# Patient Record
Sex: Female | Born: 1970 | Race: White | Hispanic: No | Marital: Married | Smoking: Current every day smoker
Health system: Southern US, Community
[De-identification: ages and names within clinical notes are randomized; demographics above are authoritative.]

## PROBLEM LIST (undated history)

## (undated) DIAGNOSIS — J45909 Unspecified asthma, uncomplicated: Secondary | ICD-10-CM

## (undated) DIAGNOSIS — K589 Irritable bowel syndrome without diarrhea: Secondary | ICD-10-CM

## (undated) DIAGNOSIS — N809 Endometriosis, unspecified: Secondary | ICD-10-CM

## (undated) DIAGNOSIS — K6289 Other specified diseases of anus and rectum: Secondary | ICD-10-CM

## (undated) DIAGNOSIS — J939 Pneumothorax, unspecified: Secondary | ICD-10-CM

## (undated) DIAGNOSIS — J449 Chronic obstructive pulmonary disease, unspecified: Secondary | ICD-10-CM

## (undated) HISTORY — PX: ENDOMETRIAL ABLATION: SHX621

## (undated) HISTORY — PX: LAPAROSCOPIC ENDOMETRIOSIS FULGURATION: SUR769

## (undated) HISTORY — PX: ENDOMETRIAL BIOPSY: SHX622

---

## 2005-01-09 ENCOUNTER — Encounter: Admission: RE | Admit: 2005-01-09 | Discharge: 2005-01-09 | Payer: Self-pay | Admitting: Family Medicine

## 2009-02-05 ENCOUNTER — Ambulatory Visit: Payer: Self-pay | Admitting: Radiology

## 2009-02-05 ENCOUNTER — Emergency Department (HOSPITAL_BASED_OUTPATIENT_CLINIC_OR_DEPARTMENT_OTHER): Admission: EM | Admit: 2009-02-05 | Discharge: 2009-02-05 | Payer: Self-pay | Admitting: Emergency Medicine

## 2009-02-16 ENCOUNTER — Emergency Department (HOSPITAL_BASED_OUTPATIENT_CLINIC_OR_DEPARTMENT_OTHER): Admission: EM | Admit: 2009-02-16 | Discharge: 2009-02-16 | Payer: Self-pay | Admitting: Emergency Medicine

## 2009-02-26 ENCOUNTER — Emergency Department (HOSPITAL_BASED_OUTPATIENT_CLINIC_OR_DEPARTMENT_OTHER): Admission: EM | Admit: 2009-02-26 | Discharge: 2009-02-26 | Payer: Self-pay | Admitting: Emergency Medicine

## 2009-02-26 ENCOUNTER — Ambulatory Visit: Payer: Self-pay | Admitting: Diagnostic Radiology

## 2009-03-11 ENCOUNTER — Emergency Department (HOSPITAL_BASED_OUTPATIENT_CLINIC_OR_DEPARTMENT_OTHER): Admission: EM | Admit: 2009-03-11 | Discharge: 2009-03-11 | Payer: Self-pay | Admitting: Emergency Medicine

## 2009-04-13 ENCOUNTER — Emergency Department (HOSPITAL_BASED_OUTPATIENT_CLINIC_OR_DEPARTMENT_OTHER): Admission: EM | Admit: 2009-04-13 | Discharge: 2009-04-13 | Payer: Self-pay | Admitting: Emergency Medicine

## 2009-04-13 ENCOUNTER — Ambulatory Visit: Payer: Self-pay | Admitting: Diagnostic Radiology

## 2009-04-15 HISTORY — PX: CHEST TUBE INSERTION: SHX231

## 2009-04-16 ENCOUNTER — Emergency Department (HOSPITAL_BASED_OUTPATIENT_CLINIC_OR_DEPARTMENT_OTHER): Admission: EM | Admit: 2009-04-16 | Discharge: 2009-04-16 | Payer: Self-pay | Admitting: Emergency Medicine

## 2009-04-17 ENCOUNTER — Ambulatory Visit: Payer: Self-pay | Admitting: Diagnostic Radiology

## 2009-04-17 ENCOUNTER — Emergency Department (HOSPITAL_BASED_OUTPATIENT_CLINIC_OR_DEPARTMENT_OTHER): Admission: EM | Admit: 2009-04-17 | Discharge: 2009-04-17 | Payer: Self-pay | Admitting: Emergency Medicine

## 2009-05-28 ENCOUNTER — Emergency Department (HOSPITAL_BASED_OUTPATIENT_CLINIC_OR_DEPARTMENT_OTHER): Admission: EM | Admit: 2009-05-28 | Discharge: 2009-05-29 | Payer: Self-pay | Admitting: Emergency Medicine

## 2009-06-01 ENCOUNTER — Emergency Department (HOSPITAL_BASED_OUTPATIENT_CLINIC_OR_DEPARTMENT_OTHER): Admission: EM | Admit: 2009-06-01 | Discharge: 2009-06-01 | Payer: Self-pay | Admitting: Emergency Medicine

## 2009-06-01 ENCOUNTER — Ambulatory Visit: Payer: Self-pay | Admitting: Diagnostic Radiology

## 2009-06-15 ENCOUNTER — Ambulatory Visit: Payer: Self-pay | Admitting: Interventional Radiology

## 2009-06-15 ENCOUNTER — Emergency Department (HOSPITAL_BASED_OUTPATIENT_CLINIC_OR_DEPARTMENT_OTHER): Admission: EM | Admit: 2009-06-15 | Discharge: 2009-06-15 | Payer: Self-pay | Admitting: Emergency Medicine

## 2009-06-28 ENCOUNTER — Emergency Department (HOSPITAL_BASED_OUTPATIENT_CLINIC_OR_DEPARTMENT_OTHER): Admission: EM | Admit: 2009-06-28 | Discharge: 2009-06-28 | Payer: Self-pay | Admitting: Emergency Medicine

## 2009-08-03 ENCOUNTER — Ambulatory Visit: Payer: Self-pay | Admitting: Diagnostic Radiology

## 2009-08-03 ENCOUNTER — Emergency Department (HOSPITAL_BASED_OUTPATIENT_CLINIC_OR_DEPARTMENT_OTHER): Admission: EM | Admit: 2009-08-03 | Discharge: 2009-08-03 | Payer: Self-pay | Admitting: Emergency Medicine

## 2009-08-26 ENCOUNTER — Emergency Department (HOSPITAL_BASED_OUTPATIENT_CLINIC_OR_DEPARTMENT_OTHER): Admission: EM | Admit: 2009-08-26 | Discharge: 2009-08-26 | Payer: Self-pay | Admitting: Emergency Medicine

## 2010-07-03 LAB — URINALYSIS, ROUTINE W REFLEX MICROSCOPIC
Bilirubin Urine: NEGATIVE
Ketones, ur: NEGATIVE mg/dL
Leukocytes, UA: NEGATIVE
Protein, ur: NEGATIVE mg/dL
Protein, ur: NEGATIVE mg/dL
Specific Gravity, Urine: 1.004 — ABNORMAL LOW (ref 1.005–1.030)
Urobilinogen, UA: 0.2 mg/dL (ref 0.0–1.0)
pH: 7 (ref 5.0–8.0)

## 2010-07-03 LAB — CBC
HCT: 41.9 % (ref 36.0–46.0)
MCV: 95.8 fL (ref 78.0–100.0)
RDW: 14.3 % (ref 11.5–15.5)
WBC: 8.3 10*3/uL (ref 4.0–10.5)

## 2010-07-03 LAB — COMPREHENSIVE METABOLIC PANEL
Alkaline Phosphatase: 73 U/L (ref 39–117)
BUN: 4 mg/dL — ABNORMAL LOW (ref 6–23)
GFR calc Af Amer: >60 mL/min (ref >60)
GFR calc non Af Amer: >60 mL/min (ref >60)
Potassium: 3.4 mEq/L — ABNORMAL LOW (ref 3.5–5.1)
Sodium: 141 mEq/L (ref 135–145)
Total Bilirubin: 0.3 mg/dL (ref 0.3–1.2)

## 2010-07-03 LAB — URINE MICROSCOPIC-ADD ON

## 2010-07-03 LAB — DIFFERENTIAL
Basophils Absolute: 0 10*3/uL (ref 0.0–0.1)
Basophils Relative: 1 % (ref 0–1)
Eosinophils Absolute: 0.2 10*3/uL (ref 0.0–0.7)
Eosinophils Relative: 2 % (ref 0–5)
Lymphocytes Relative: 27 % (ref 12–46)
Lymphs Abs: 2.3 10*3/uL (ref 0.7–4.0)

## 2010-07-03 LAB — WET PREP, GENITAL

## 2010-07-03 LAB — GC/CHLAMYDIA PROBE AMP, GENITAL
Chlamydia, DNA Probe: NEGATIVE
GC Probe Amp, Genital: NEGATIVE

## 2010-07-03 LAB — PREGNANCY, URINE: Preg Test, Ur: NEGATIVE

## 2010-07-05 LAB — URINALYSIS, ROUTINE W REFLEX MICROSCOPIC
Glucose, UA: NEGATIVE mg/dL
Ketones, ur: NEGATIVE mg/dL
Leukocytes, UA: NEGATIVE
Nitrite: NEGATIVE
Nitrite: NEGATIVE
Specific Gravity, Urine: 1.002 — ABNORMAL LOW (ref 1.005–1.030)
Specific Gravity, Urine: 1.008 (ref 1.005–1.030)
pH: 6.5 (ref 5.0–8.0)
pH: 6.5 (ref 5.0–8.0)

## 2010-07-05 LAB — URINE MICROSCOPIC-ADD ON

## 2010-07-05 LAB — DIFFERENTIAL
Basophils Absolute: 0.1 10*3/uL (ref 0.0–0.1)
Eosinophils Absolute: 0.1 10*3/uL (ref 0.0–0.7)
Lymphocytes Relative: 20 % (ref 12–46)
Lymphocytes Relative: 28 % (ref 12–46)
Lymphs Abs: 2.1 10*3/uL (ref 0.7–4.0)
Lymphs Abs: 2.8 10*3/uL (ref 0.7–4.0)
Monocytes Relative: 4 % (ref 3–12)
Neutro Abs: 7.3 10*3/uL (ref 1.7–7.7)
Neutrophils Relative %: 65 % (ref 43–77)
Neutrophils Relative %: 74 % (ref 43–77)

## 2010-07-05 LAB — CBC
Hemoglobin: 14.9 g/dL (ref 12.0–15.0)
MCHC: 34.1 g/dL (ref 30.0–36.0)
MCV: 93.8 fL (ref 78.0–100.0)
Platelets: 256 10*3/uL (ref 150–400)
RDW: 12.4 % (ref 11.5–15.5)
RDW: 12.6 % (ref 11.5–15.5)
WBC: 10 10*3/uL (ref 4.0–10.5)

## 2010-07-05 LAB — WET PREP, GENITAL
Trich, Wet Prep: NONE SEEN
Yeast Wet Prep HPF POC: NONE SEEN

## 2010-07-05 LAB — BASIC METABOLIC PANEL
BUN: 6 mg/dL (ref 6–23)
Creatinine, Ser: 0.8 mg/dL (ref 0.4–1.2)
GFR calc non Af Amer: >60 mL/min (ref >60)

## 2010-07-05 LAB — PREGNANCY, URINE
Preg Test, Ur: NEGATIVE
Preg Test, Ur: NEGATIVE

## 2010-07-05 LAB — COMPREHENSIVE METABOLIC PANEL
ALT: 9 U/L (ref 0–35)
CO2: 24 mEq/L (ref 19–32)
Calcium: 8.7 mg/dL (ref 8.4–10.5)
Creatinine, Ser: 0.6 mg/dL (ref 0.4–1.2)
GFR calc non Af Amer: >60 mL/min (ref >60)
Glucose, Bld: 87 mg/dL (ref 70–99)
Sodium: 143 mEq/L (ref 135–145)
Total Protein: 7.3 g/dL (ref 6.0–8.3)

## 2010-07-05 LAB — GC/CHLAMYDIA PROBE AMP, GENITAL: GC Probe Amp, Genital: NEGATIVE

## 2010-07-18 LAB — URINALYSIS, ROUTINE W REFLEX MICROSCOPIC
Glucose, UA: NEGATIVE mg/dL
Ketones, ur: NEGATIVE mg/dL
Protein, ur: NEGATIVE mg/dL
pH: 7 (ref 5.0–8.0)

## 2010-07-18 LAB — URINE MICROSCOPIC-ADD ON

## 2010-07-18 LAB — POCT CARDIAC MARKERS: CKMB, poc: <1 ng/mL — ABNORMAL LOW (ref 1.0–8.0)

## 2010-07-19 LAB — COMPREHENSIVE METABOLIC PANEL
ALT: 4 U/L (ref 0–35)
AST: 11 U/L (ref 0–37)
Albumin: 3.9 g/dL (ref 3.5–5.2)
Alkaline Phosphatase: 68 U/L (ref 39–117)
Chloride: 105 mEq/L (ref 96–112)
GFR calc Af Amer: >60 mL/min (ref >60)
Potassium: 3.5 mEq/L (ref 3.5–5.1)
Sodium: 142 mEq/L (ref 135–145)
Total Bilirubin: 0.2 mg/dL — ABNORMAL LOW (ref 0.3–1.2)

## 2010-07-19 LAB — DIFFERENTIAL
Basophils Absolute: 0.1 10*3/uL (ref 0.0–0.1)
Eosinophils Absolute: 0.3 10*3/uL (ref 0.0–0.7)
Eosinophils Relative: 4 % (ref 0–5)
Lymphocytes Relative: 38 % (ref 12–46)
Monocytes Absolute: 0.6 10*3/uL (ref 0.1–1.0)

## 2010-07-19 LAB — URINE MICROSCOPIC-ADD ON

## 2010-07-19 LAB — CBC
Platelets: 207 10*3/uL (ref 150–400)
RBC: 3.97 MIL/uL (ref 3.87–5.11)
WBC: 8.4 10*3/uL (ref 4.0–10.5)

## 2010-07-19 LAB — URINALYSIS, ROUTINE W REFLEX MICROSCOPIC
Bilirubin Urine: NEGATIVE
Glucose, UA: NEGATIVE mg/dL
Specific Gravity, Urine: 1.017 (ref 1.005–1.030)

## 2010-07-19 LAB — WET PREP, GENITAL
Trich, Wet Prep: NONE SEEN
Yeast Wet Prep HPF POC: NONE SEEN

## 2010-07-19 LAB — GC/CHLAMYDIA PROBE AMP, GENITAL: Chlamydia, DNA Probe: NEGATIVE

## 2010-07-19 LAB — RPR: RPR Ser Ql: NONREACTIVE

## 2014-01-19 ENCOUNTER — Emergency Department (HOSPITAL_BASED_OUTPATIENT_CLINIC_OR_DEPARTMENT_OTHER)
Admission: EM | Admit: 2014-01-19 | Discharge: 2014-01-19 | Disposition: A | Discharge status: Home / Self Care | Payer: Self-pay | Attending: Emergency Medicine | Admitting: Emergency Medicine

## 2014-01-19 ENCOUNTER — Encounter (HOSPITAL_BASED_OUTPATIENT_CLINIC_OR_DEPARTMENT_OTHER): Payer: Self-pay | Admitting: Emergency Medicine

## 2014-01-19 DIAGNOSIS — Z79899 Other long term (current) drug therapy: Secondary | ICD-10-CM | POA: Insufficient documentation

## 2014-01-19 DIAGNOSIS — Z720 Tobacco use: Secondary | ICD-10-CM | POA: Insufficient documentation

## 2014-01-19 DIAGNOSIS — J449 Chronic obstructive pulmonary disease, unspecified: Secondary | ICD-10-CM | POA: Insufficient documentation

## 2014-01-19 DIAGNOSIS — R21 Rash and other nonspecific skin eruption: Secondary | ICD-10-CM | POA: Insufficient documentation

## 2014-01-19 DIAGNOSIS — Z7952 Long term (current) use of systemic steroids: Secondary | ICD-10-CM | POA: Insufficient documentation

## 2014-01-19 HISTORY — DX: Unspecified asthma, uncomplicated: J45.909

## 2014-01-19 HISTORY — DX: Endometriosis, unspecified: N80.9

## 2014-01-19 HISTORY — DX: Chronic obstructive pulmonary disease, unspecified: J44.9

## 2014-01-19 MED ORDER — PREDNISONE 20 MG PO TABS: ORAL_TABLET | ORAL | Status: DC

## 2014-01-19 NOTE — Discharge Instructions (Signed)
Rash A rash is a change in the color or texture of your skin. There are many different types of rashes. You may have other problems that accompany your rash. CAUSES   Infections.  Allergic reactions. This can include allergies to pets or foods.  Certain medicines.  Exposure to certain chemicals, soaps, or cosmetics.  Heat.  Exposure to poisonous plants.  Tumors, both cancerous and noncancerous. SYMPTOMS   Redness.  Scaly skin.  Itchy skin.  Dry or cracked skin.  Bumps.  Blisters.  Pain. DIAGNOSIS  Your caregiver may do a physical exam to determine what type of rash you have. A skin sample (biopsy) may be taken and examined under a microscope. TREATMENT  Treatment depends on the type of rash you have. Your caregiver may prescribe certain medicines. For serious conditions, you may need to see a skin doctor (dermatologist). HOME CARE INSTRUCTIONS   Avoid the substance that caused your rash.  Do not scratch your rash. This can cause infection.  You may take cool baths to help stop itching.  Only take over-the-counter or prescription medicines as directed by your caregiver.  Keep all follow-up appointments as directed by your caregiver. SEEK IMMEDIATE MEDICAL CARE IF:  You have increasing pain, swelling, or redness.  You have a fever.  You have new or severe symptoms.  You have body aches, diarrhea, or vomiting.  Your rash is not better after 3 days. MAKE SURE YOU:  Understand these instructions.  Will watch your condition.  Will get help right away if you are not doing well or get worse. Document Released: 03/22/2002 Document Revised: 06/24/2011 Document Reviewed: 01/14/2011 [facility] Patient Information 2015 [place], [place]. This information is not intended to replace advice given to you by your health care provider. Make sure you discuss any questions you have with your health care provider.    Poison [employer] ivy is a inflammation of the skin  (contact dermatitis) caused by touching the allergens on the leaves of the ivy plant following previous exposure to the plant. The rash usually appears 48 hours after exposure. The rash is usually bumps (papules) or blisters (vesicles) in a linear pattern. Depending on your own sensitivity, the rash may simply cause redness and itching, or it may also progress to blisters which may break open. These must be well cared for to prevent secondary bacterial (germ) infection, followed by scarring. Keep any open areas dry, clean, dressed, and covered with an antibacterial ointment if needed. The eyes may also get puffy. The puffiness is worst in the morning and gets better as the day progresses. This dermatitis usually heals without scarring, within 2 to 3 weeks without treatment. HOME CARE INSTRUCTIONS  Thoroughly wash with soap and water as soon as you have been exposed to poison ivy. You have about one half hour to remove the plant resin before it will cause the rash. This washing will destroy the oil or antigen on the skin that is causing, or will cause, the rash. Be sure to wash under your fingernails as any plant resin there will continue to spread the rash. Do not rub skin vigorously when washing affected area. Poison ivy cannot spread if no oil from the plant remains on your body. A rash that has progressed to weeping sores will not spread the rash unless you have not washed thoroughly. It is also important to wash any clothes you have been wearing as these may carry active allergens. The rash will return if you wear the unwashed  clothing, even several days later. Avoidance of the plant in the future is the best measure. Poison ivy plant can be recognized by the number of leaves. Generally, poison ivy has three leaves with flowering branches on a single stem. Diphenhydramine may be purchased over the counter and used as needed for itching. Do not drive with this medication if it makes you drowsy.Ask your  caregiver about medication for children. SEEK MEDICAL CARE IF:  Open sores develop.  Redness spreads beyond area of rash.  You notice purulent (pus-like) discharge.  You have increased pain.  Other signs of infection develop (such as fever). Document Released: 03/29/2000 Document Revised: 06/24/2011 Document Reviewed: 09/09/2008 [facility] Patient Information 2015 [place]. This information is not intended to replace advice given to you by your health care provider. Make sure you discuss any questions you have with your health care provider.

## 2014-01-19 NOTE — ED Notes (Signed)
Pt amb to triage with quick steady gait in nad. Pt reports rash x Saturday to chest, waist and arms.

## 2014-01-19 NOTE — ED Provider Notes (Signed)
CSN: [id number]     Arrival date & time 01/19/14  1713 History   First MD Initiated Contact with Patient 01/19/14 1722     Chief Complaint  Patient presents with  . Rash     (Consider location/radiation/quality/duration/timing/severity/associated sxs/prior Treatment) HPI 43 year old female presents with a rash over the last 5 days. She does not know how she acquired this. States she does not go outside often. No new detergents or clothes. No new medicines or food. No fevers. The rashes "itches to the bone" it is painful. Occasionally some of these blisters have ruptured and have clear fluid. No one else has similar rash at home. She's been using calamine lotion without any relief.  Past Medical History  Diagnosis Date  . Asthma   . COPD (chronic obstructive pulmonary disease)   . Endometriosis    History reviewed. No pertinent past surgical history. History reviewed. No pertinent family history. History  Substance Use Topics  . Smoking status: Current Every Day Smoker  . Smokeless tobacco: Not on file  . Alcohol Use: Not on file   OB History   Grav Para Term Preterm Abortions TAB SAB Ect Mult Living                 Review of Systems  Constitutional: Negative for fever.  Skin: Positive for rash. Negative for wound.  All other systems reviewed and are negative.     Allergies  Tramadol  Home Medications   Prior to Admission medications   Medication Sig Start Date End Date Taking? Authorizing Provider  albuterol (PROVENTIL HFA;VENTOLIN HFA) 108 (90 BASE) MCG/ACT inhaler Inhale into the lungs every 6 (six) hours as needed for wheezing or shortness of breath.   Yes Historical Provider, MD  predniSONE (DELTASONE) 20 MG tablet 3 tabs po daily x 3 days, then 2 tabs x 3 days, then 1.5 tabs x 3 days, then 1 tab x 3 days, then 0.5 tabs x 3 days 01/19/14   Audree Camel, MD   BP 126/77  Pulse 94  Temp(Src) 98.6 F (37 C) (Oral)  Resp 16  Ht 5\' 4"  (1.626 m)  Wt 112 lb  (50.803 kg)  BMI 19.22 kg/m2  SpO2 100%  LMP 01/05/2014 Physical Exam  Nursing note and vitals reviewed. Constitutional: She is oriented to person, place, and time. She appears well-developed and well-nourished. No distress.  HENT:  Head: Normocephalic and atraumatic.  Right Ear: External ear normal.  Left Ear: External ear normal.  Nose: Nose normal.  Eyes: Right eye exhibits no discharge. Left eye exhibits no discharge.  Cardiovascular: Normal rate.   Pulmonary/Chest: Effort normal.  Abdominal: She exhibits no distension.  Neurological: She is alert and oriented to person, place, and time.  Skin: Skin is warm and dry. Rash noted. Rash is vesicular. She is not diaphoretic.       ED Course  Procedures (including critical care time) Labs Review Labs Reviewed - No data to display  Imaging Review No results found.   EKG Interpretation None      MDM   Final diagnoses:  Rash and nonspecific skin eruption    Patient's rash is most consistent with a poison ivy or poison oak given the appearance. She does not have much exposure that would've caused this however. Due to this, will treat with a steroid taper, continue calamine lotion, and follow up with a dermatologist. Given that this is vesicular there is some consideration could be shingles, but there is no dermatomal  distribution and it is present in multiple areas.    Audree CamelScott T Kazoua Gossen, MD 01/19/14 [id number]

## 2014-04-15 ENCOUNTER — Encounter (HOSPITAL_BASED_OUTPATIENT_CLINIC_OR_DEPARTMENT_OTHER): Payer: Self-pay | Admitting: *Deleted

## 2014-04-15 ENCOUNTER — Emergency Department (HOSPITAL_BASED_OUTPATIENT_CLINIC_OR_DEPARTMENT_OTHER): Payer: Self-pay

## 2014-04-15 ENCOUNTER — Emergency Department (HOSPITAL_BASED_OUTPATIENT_CLINIC_OR_DEPARTMENT_OTHER)
Admission: EM | Admit: 2014-04-15 | Discharge: 2014-04-15 | Disposition: A | Discharge status: Home / Self Care | Payer: Self-pay | Attending: Emergency Medicine | Admitting: Emergency Medicine

## 2014-04-15 DIAGNOSIS — R0781 Pleurodynia: Secondary | ICD-10-CM

## 2014-04-15 DIAGNOSIS — Y9389 Activity, other specified: Secondary | ICD-10-CM | POA: Insufficient documentation

## 2014-04-15 DIAGNOSIS — M545 Low back pain, unspecified: Secondary | ICD-10-CM

## 2014-04-15 DIAGNOSIS — J449 Chronic obstructive pulmonary disease, unspecified: Secondary | ICD-10-CM | POA: Insufficient documentation

## 2014-04-15 DIAGNOSIS — Z8742 Personal history of other diseases of the female genital tract: Secondary | ICD-10-CM | POA: Insufficient documentation

## 2014-04-15 DIAGNOSIS — Z720 Tobacco use: Secondary | ICD-10-CM | POA: Insufficient documentation

## 2014-04-15 DIAGNOSIS — Y9289 Other specified places as the place of occurrence of the external cause: Secondary | ICD-10-CM | POA: Insufficient documentation

## 2014-04-15 DIAGNOSIS — Y998 Other external cause status: Secondary | ICD-10-CM | POA: Insufficient documentation

## 2014-04-15 DIAGNOSIS — Z23 Encounter for immunization: Secondary | ICD-10-CM | POA: Insufficient documentation

## 2014-04-15 DIAGNOSIS — S60221A Contusion of right hand, initial encounter: Secondary | ICD-10-CM

## 2014-04-15 DIAGNOSIS — S161XXA Strain of muscle, fascia and tendon at neck level, initial encounter: Secondary | ICD-10-CM

## 2014-04-15 DIAGNOSIS — Z79899 Other long term (current) drug therapy: Secondary | ICD-10-CM | POA: Insufficient documentation

## 2014-04-15 DIAGNOSIS — R0789 Other chest pain: Secondary | ICD-10-CM

## 2014-04-15 DIAGNOSIS — S299XXA Unspecified injury of thorax, initial encounter: Secondary | ICD-10-CM | POA: Insufficient documentation

## 2014-04-15 DIAGNOSIS — S00412A Abrasion of left ear, initial encounter: Secondary | ICD-10-CM

## 2014-04-15 DIAGNOSIS — S3992XA Unspecified injury of lower back, initial encounter: Secondary | ICD-10-CM | POA: Insufficient documentation

## 2014-04-15 MED ORDER — HYDROCODONE-ACETAMINOPHEN 5-325 MG PO TABS: 1.0000 | ORAL_TABLET | Freq: Four times a day (QID) | ORAL | Status: DC | PRN

## 2014-04-15 MED ORDER — TETANUS-DIPHTH-ACELL PERTUSSIS 5-2.5-18.5 LF-MCG/0.5 IM SUSP
0.5000 mL | Freq: Once | INTRAMUSCULAR | Status: AC
  Administered 2014-04-15: 0.5 mL via INTRAMUSCULAR
  Filled 2014-04-15: qty 0.5

## 2014-04-15 MED ORDER — HYDROCODONE-ACETAMINOPHEN 5-325 MG PO TABS
1.0000 | ORAL_TABLET | Freq: Once | ORAL | Status: AC
  Administered 2014-04-15: 1 via ORAL
  Filled 2014-04-15: qty 1

## 2014-04-15 NOTE — Discharge Instructions (Signed)
Abrasion An abrasion is a cut or scrape of the skin. Abrasions do not extend through all layers of the skin and most heal within 10 days. It is important to care for your abrasion properly to prevent infection. CAUSES  Most abrasions are caused by falling on, or gliding across, the ground or other surface. When your skin rubs on something, the outer and inner layer of skin rubs off, causing an abrasion. DIAGNOSIS  Your caregiver will be able to diagnose an abrasion during a physical exam.  TREATMENT  Your treatment depends on how large and deep the abrasion is. Generally, your abrasion will be cleaned with water and a mild soap to remove any dirt or debris. An antibiotic ointment may be put over the abrasion to prevent an infection. A bandage (dressing) may be wrapped around the abrasion to keep it from getting dirty.  You may need a tetanus shot if:  You cannot remember when you had your last tetanus shot.  You have never had a tetanus shot.  The injury broke your skin. If you get a tetanus shot, your arm may swell, get red, and feel warm to the touch. This is common and not a problem. If you need a tetanus shot and you choose not to have one, there is a rare chance of getting tetanus. Sickness from tetanus can be serious.  HOME CARE INSTRUCTIONS   If a dressing was applied, change it at least once a day or as directed by your caregiver. If the bandage sticks, soak it off with warm water.   Wash the area with water and a mild soap to remove all the ointment 2 times a day. Rinse off the soap and pat the area dry with a clean towel.   Reapply any ointment as directed by your caregiver. This will help prevent infection and keep the bandage from sticking. Use gauze over the wound and under the dressing to help keep the bandage from sticking.   Change your dressing right away if it becomes wet or dirty.   Only take over-the-counter or prescription medicines for pain, discomfort, or fever as  directed by your caregiver.   Follow up with your caregiver within 24-48 hours for a wound check, or as directed. If you were not given a wound-check appointment, look closely at your abrasion for redness, swelling, or pus. These are signs of infection. SEEK IMMEDIATE MEDICAL CARE IF:   You have increasing pain in the wound.   You have redness, swelling, or tenderness around the wound.   You have pus coming from the wound.   You have a fever or persistent symptoms for more than 2-3 days.  You have a fever and your symptoms suddenly get worse.  You have a bad smell coming from the wound or dressing.  MAKE SURE YOU:   Understand these instructions.  Will watch your condition.  Will get help right away if you are not doing well or get worse. Document Released: 01/09/2005 Document Revised: 03/18/2012 Document Reviewed: 03/05/2011 [facility] Patient Information 2015 [place], [place]. This information is not intended to replace advice given to you by your health care provider. Make sure you discuss any questions you have with your health care provider.  Cervical Sprain A cervical sprain is when the tissues (ligaments) that hold the neck bones in place stretch or tear. HOME CARE   Put ice on the injured area.  Put ice in a plastic bag.  Place a towel between your skin and  the bag.  Leave the ice on for 15-20 minutes, 3-4 times a day.  You may have been given a collar to wear. This collar keeps your neck from moving while you heal.  Do not take the collar off unless told by your doctor.  If you have long hair, keep it outside of the collar.  Ask your doctor before changing the position of your collar. You may need to change its position over time to make it more comfortable.  If you are allowed to take off the collar for cleaning or bathing, follow your doctor's instructions on how to do it safely.  Keep your collar clean by wiping it with mild soap and water. Dry it  completely. If the collar has removable pads, remove them every 1-2 days to hand wash them with soap and water. Allow them to air dry. They should be dry before you wear them in the collar.  Do not drive while wearing the collar.  Only take medicine as told by your doctor.  Keep all doctor visits as told.  Keep all physical therapy visits as told.  Adjust your work station so that you have good posture while you work.  Avoid positions and activities that make your problems worse.  Warm up and stretch before being active. GET HELP IF:  Your pain is not controlled with medicine.  You cannot take less pain medicine over time as planned.  Your activity level does not improve as expected. GET HELP RIGHT AWAY IF:   You are bleeding.  Your stomach is upset.  You have an allergic reaction to your medicine.  You develop new problems that you cannot explain.  You lose feeling (become numb) or you cannot move any part of your body (paralysis).  You have tingling or weakness in any part of your body.  Your symptoms get worse. Symptoms include:  Pain, soreness, stiffness, puffiness (swelling), or a burning feeling in your neck.  Pain when your neck is touched.  Shoulder or upper back pain.  Limited ability to move your neck.  Headache.  Dizziness.  Your hands or arms feel week, lose feeling, or tingle.  Muscle spasms.  Difficulty swallowing or chewing. MAKE SURE YOU:   Understand these instructions.  Will watch your condition.  Will get help right away if you are not doing well or get worse. Document Released: 09/18/2007 Document Revised: 12/02/2012 Document Reviewed: 10/07/2012 [facility] Patient Information 2015 [place], [place]. This information is not intended to replace advice given to you by your health care provider. Make sure you discuss any questions you have with your health care provider.  Contusion A contusion is a deep bruise. Contusions happen when an  injury causes bleeding under the skin. Signs of bruising include pain, puffiness (swelling), and discolored skin. The contusion may turn blue, purple, or yellow. HOME CARE   Put ice on the injured area.  Put ice in a plastic bag.  Place a towel between your skin and the bag.  Leave the ice on for 15-20 minutes, 03-04 times a day.  Only take medicine as told by your doctor.  Rest the injured area.  If possible, raise (elevate) the injured area to lessen puffiness. GET HELP RIGHT AWAY IF:   You have more bruising or puffiness.  You have pain that is getting worse.  Your puffiness or pain is not helped by medicine. MAKE SURE YOU:   Understand these instructions.  Will watch your condition.  Will get help right  away if you are not doing well or get worse. Document Released: 09/18/2007 Document Revised: 06/24/2011 Document Reviewed: 02/04/2011 [facility] Patient Information 2015 [place], [place]. This information is not intended to replace advice given to you by your health care provider. Make sure you discuss any questions you have with your health care provider.
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
## 2014-06-30 ENCOUNTER — Encounter (HOSPITAL_BASED_OUTPATIENT_CLINIC_OR_DEPARTMENT_OTHER): Payer: Self-pay | Admitting: *Deleted

## 2014-06-30 ENCOUNTER — Emergency Department (HOSPITAL_BASED_OUTPATIENT_CLINIC_OR_DEPARTMENT_OTHER)
Admission: EM | Admit: 2014-06-30 | Discharge: 2014-06-30 | Discharge status: Against Medical Advice | Payer: Self-pay | Attending: Emergency Medicine | Admitting: Emergency Medicine

## 2014-06-30 DIAGNOSIS — Y9389 Activity, other specified: Secondary | ICD-10-CM | POA: Insufficient documentation

## 2014-06-30 DIAGNOSIS — Y998 Other external cause status: Secondary | ICD-10-CM | POA: Insufficient documentation

## 2014-06-30 DIAGNOSIS — Y9289 Other specified places as the place of occurrence of the external cause: Secondary | ICD-10-CM | POA: Insufficient documentation

## 2014-06-30 DIAGNOSIS — S3992XA Unspecified injury of lower back, initial encounter: Secondary | ICD-10-CM | POA: Insufficient documentation

## 2014-06-30 DIAGNOSIS — W182XXA Fall in (into) shower or empty bathtub, initial encounter: Secondary | ICD-10-CM | POA: Insufficient documentation

## 2014-06-30 DIAGNOSIS — S6991XA Unspecified injury of right wrist, hand and finger(s), initial encounter: Secondary | ICD-10-CM | POA: Insufficient documentation

## 2014-06-30 DIAGNOSIS — Z720 Tobacco use: Secondary | ICD-10-CM | POA: Insufficient documentation

## 2014-06-30 DIAGNOSIS — J449 Chronic obstructive pulmonary disease, unspecified: Secondary | ICD-10-CM | POA: Insufficient documentation
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
## 2014-06-30 NOTE — ED Notes (Signed)
Unable to find pt in waiting room. 

## 2014-10-10 ENCOUNTER — Emergency Department (HOSPITAL_BASED_OUTPATIENT_CLINIC_OR_DEPARTMENT_OTHER)
Admission: EM | Admit: 2014-10-10 | Discharge: 2014-10-10 | Disposition: A | Discharge status: Home / Self Care | Payer: Self-pay | Attending: Emergency Medicine | Admitting: Emergency Medicine

## 2014-10-10 ENCOUNTER — Emergency Department (HOSPITAL_BASED_OUTPATIENT_CLINIC_OR_DEPARTMENT_OTHER): Payer: Self-pay

## 2014-10-10 ENCOUNTER — Encounter (HOSPITAL_BASED_OUTPATIENT_CLINIC_OR_DEPARTMENT_OTHER): Payer: Self-pay

## 2014-10-10 DIAGNOSIS — Z9889 Other specified postprocedural states: Secondary | ICD-10-CM | POA: Insufficient documentation

## 2014-10-10 DIAGNOSIS — Z8742 Personal history of other diseases of the female genital tract: Secondary | ICD-10-CM | POA: Insufficient documentation

## 2014-10-10 DIAGNOSIS — J449 Chronic obstructive pulmonary disease, unspecified: Secondary | ICD-10-CM | POA: Insufficient documentation

## 2014-10-10 DIAGNOSIS — R112 Nausea with vomiting, unspecified: Secondary | ICD-10-CM | POA: Insufficient documentation

## 2014-10-10 DIAGNOSIS — R1031 Right lower quadrant pain: Secondary | ICD-10-CM | POA: Insufficient documentation

## 2014-10-10 DIAGNOSIS — Z720 Tobacco use: Secondary | ICD-10-CM | POA: Insufficient documentation

## 2014-10-10 DIAGNOSIS — Z3202 Encounter for pregnancy test, result negative: Secondary | ICD-10-CM | POA: Insufficient documentation

## 2014-10-10 LAB — CBC WITH DIFFERENTIAL/PLATELET
Basophils Absolute: 0.1 10*3/uL (ref 0.0–0.1)
Basophils Relative: 1 % (ref 0–1)
Eosinophils Absolute: 0.2 10*3/uL (ref 0.0–0.7)
Eosinophils Relative: 2 % (ref 0–5)
HEMATOCRIT: 42.2 % (ref 36.0–46.0)
HEMOGLOBIN: 14.1 g/dL (ref 12.0–15.0)
LYMPHS ABS: 2.5 10*3/uL (ref 0.7–4.0)
LYMPHS PCT: 28 % (ref 12–46)
MCH: 31.3 pg (ref 26.0–34.0)
MCHC: 33.4 g/dL (ref 30.0–36.0)
MCV: 93.6 fL (ref 78.0–100.0)
MONO ABS: 0.8 10*3/uL (ref 0.1–1.0)
MONOS PCT: 9 % (ref 3–12)
NEUTROS ABS: 5.6 10*3/uL (ref 1.7–7.7)
Neutrophils Relative %: 60 % (ref 43–77)
Platelets: 280 10*3/uL (ref 150–400)
RBC: 4.51 MIL/uL (ref 3.87–5.11)
RDW: 12.9 % (ref 11.5–15.5)
WBC: 9.2 10*3/uL (ref 4.0–10.5)

## 2014-10-10 LAB — COMPREHENSIVE METABOLIC PANEL
ALT: 11 U/L — ABNORMAL LOW (ref 14–54)
ANION GAP: 7 (ref 5–15)
AST: 14 U/L — AB (ref 15–41)
Albumin: 3.9 g/dL (ref 3.5–5.0)
Alkaline Phosphatase: 75 U/L (ref 38–126)
BUN: 7 mg/dL (ref 6–20)
CALCIUM: 8.7 mg/dL — AB (ref 8.9–10.3)
CO2: 23 mmol/L (ref 22–32)
Chloride: 107 mmol/L (ref 101–111)
Creatinine, Ser: 0.57 mg/dL (ref 0.44–1.00)
GFR calc Af Amer: >60 mL/min (ref >60)
GFR calc non Af Amer: >60 mL/min (ref >60)
Glucose, Bld: 98 mg/dL (ref 65–99)
POTASSIUM: 3.6 mmol/L (ref 3.5–5.1)
SODIUM: 137 mmol/L (ref 135–145)
TOTAL PROTEIN: 7.1 g/dL (ref 6.5–8.1)
Total Bilirubin: 0.3 mg/dL (ref 0.3–1.2)

## 2014-10-10 LAB — URINALYSIS, ROUTINE W REFLEX MICROSCOPIC
BILIRUBIN URINE: NEGATIVE
Glucose, UA: NEGATIVE mg/dL
KETONES UR: NEGATIVE mg/dL
Leukocytes, UA: NEGATIVE
NITRITE: NEGATIVE
PH: 7 (ref 5.0–8.0)
Protein, ur: NEGATIVE mg/dL
Specific Gravity, Urine: 1.004 — ABNORMAL LOW (ref 1.005–1.030)
Urobilinogen, UA: 0.2 mg/dL (ref 0.0–1.0)

## 2014-10-10 LAB — PREGNANCY, URINE: Preg Test, Ur: NEGATIVE

## 2014-10-10 LAB — URINE MICROSCOPIC-ADD ON

## 2014-10-10 LAB — LIPASE, BLOOD: LIPASE: 23 U/L (ref 22–51)

## 2014-10-10 MED ORDER — MORPHINE SULFATE 4 MG/ML IJ SOLN
4.0000 mg | Freq: Once | INTRAMUSCULAR | Status: AC
  Administered 2014-10-10: 4 mg via INTRAVENOUS
  Filled 2014-10-10: qty 1

## 2014-10-10 MED ORDER — IOHEXOL 300 MG/ML  SOLN
100.0000 mL | Freq: Once | INTRAMUSCULAR | Status: AC | PRN
  Administered 2014-10-10: 100 mL via INTRAVENOUS

## 2014-10-10 MED ORDER — SODIUM CHLORIDE 0.9 % IV SOLN
Freq: Once | INTRAVENOUS | Status: AC
  Administered 2014-10-10: 20:00:00 via INTRAVENOUS

## 2014-10-10 MED ORDER — OXYCODONE-ACETAMINOPHEN 5-325 MG PO TABS
1.0000 | ORAL_TABLET | Freq: Once | ORAL | Status: AC
  Administered 2014-10-10: 1 via ORAL
  Filled 2014-10-10: qty 1

## 2014-10-10 MED ORDER — OXYCODONE-ACETAMINOPHEN 5-325 MG PO TABS: 1.0000 | ORAL_TABLET | Freq: Three times a day (TID) | ORAL | Status: DC | PRN

## 2014-10-10 MED ORDER — ONDANSETRON HCL 4 MG/2ML IJ SOLN
4.0000 mg | Freq: Once | INTRAMUSCULAR | Status: AC
  Administered 2014-10-10: 4 mg via INTRAVENOUS
  Filled 2014-10-10: qty 2

## 2014-10-10 MED ORDER — IBUPROFEN 600 MG PO TABS: 600.0000 mg | ORAL_TABLET | Freq: Four times a day (QID) | ORAL | Status: DC | PRN

## 2014-10-10 NOTE — ED Notes (Signed)
abd pain started yesterday-vomiting x 3 days-denies diarrhea

## 2014-10-10 NOTE — ED Notes (Signed)
Pt drinking oral contrast for CT.

## 2014-10-10 NOTE — Discharge Instructions (Signed)

## 2014-10-10 NOTE — ED Provider Notes (Signed)
CSN: [id number]     Arrival date & time 10/10/14  1823 History   This chart was scribed for  Amanda Rhine, MD by Bethel Born, ED Scribe. This patient was seen in room MH01/MH01 and the patient's care was started at 7:11 PM.     Chief Complaint  Patient presents with  . Abdominal Pain    Patient is a 44 y.o. female presenting with abdominal pain. The history is provided by the patient. No language interpreter was used.  Abdominal Pain Pain location:  RUQ Pain quality: aching and sharp   Pain severity:  Severe Onset quality:  Gradual Duration:  1 day Timing:  Constant Progression:  Worsening Chronicity:  New Associated symptoms: nausea and vomiting   Associated symptoms: no chest pain, no chills, no cough, no diarrhea, no dysuria, no fever and no hematuria   Risk factors: not obese    Amanda Floyd is a 44 y.o. female with PMHx of endometriosis who presents to the Emergency Department complaining of increasing right sided abdominal pain with onset yesterday. The pain is constant, variably sharp and aching, and exacerbated by movement. This pain is different than pain that she has had in the past with kidney stones and located higher in the abdomen than the pain she usually has with endometriosis. Associated pulling pain near navel and 1 day of vomiting 3 days ago. Pt denies fever, increased cough (tobacco+), chest pain, diarrhea, hematuria, dysuria, and known sick contact. Irregular menstruation.  Past Medical History  Diagnosis Date  . Asthma   . COPD (chronic obstructive pulmonary disease)   . Endometriosis    Past Surgical History  Procedure Laterality Date  . Endometrial ablation     No family history on file. History  Substance Use Topics  . Smoking status: Current Every Day Smoker -- 1.00 packs/day    Types: Cigarettes  . Smokeless tobacco: Not on file  . Alcohol Use: No   OB History    No data available     Review of Systems  Constitutional: Negative for  fever and chills.  Respiratory: Negative for cough.   Cardiovascular: Negative for chest pain.  Gastrointestinal: Positive for nausea, vomiting and abdominal pain. Negative for diarrhea.  Genitourinary: Negative for dysuria and hematuria.  All other systems reviewed and are negative.   Allergies  Tramadol  Home Medications   Prior to Admission medications   Not on File   Triage Vitals: BP 130/74 mmHg  Pulse 97  Temp(Src) 98.1 F (36.7 C)  Resp 18  Ht  (1.626 m)  Wt 112 lb (50.803 kg)  BMI 19.22 kg/m2  SpO2 99%  LMP 09/17/2014  Physical Exam CONSTITUTIONAL: Well developed/well nourished HEAD: Normocephalic/atraumatic EYES: EOMI/PERRL ENMT: Mucous membranes moist NECK: supple no meningeal signs SPINE/BACK:entire spine nontender CV: S1/S2 noted, no murmurs/rubs/gallops noted LUNGS: Lungs are clear to auscultation bilaterally, no apparent distress ABDOMEN: soft, moderate RLQ tenderness, no rebound or guarding, bowel sounds noted throughout abdomen GU:no cva tenderness, no CMT, no uterine tenderness, no adnexal tenderness/mass, nurse chaperone present for exam NEURO: Pt is awake/alert/appropriate, moves all extremitiesx4.  No facial droop.   EXTREMITIES: pulses normal/equal, full ROM SKIN: warm, color normal PSYCH: no abnormalities of mood noted, alert and oriented to situation  ED Course  Procedures  DIAGNOSTIC STUDIES: Oxygen Saturation is 99% on RA, normal by my interpretation.    COORDINATION OF CARE: 7:11 PM [id number] plan which includes lab work, antiemetic, and pain management with pt at bedside and  pt agreed to plan. Labs Review Labs Reviewed  URINALYSIS, ROUTINE W REFLEX MICROSCOPIC (NOT AT [facility]) - Abnormal; Notable for the following:    Specific Gravity, Urine 1.004 (*)    Hgb urine dipstick SMALL (*)    All other components within normal limits  COMPREHENSIVE METABOLIC PANEL - Abnormal; Notable for the following:    Calcium 8.7 (*)    AST  14 (*)    ALT 11 (*)    All other components within normal limits  URINE MICROSCOPIC-ADD ON - Abnormal; Notable for the following:    Bacteria, UA FEW (*)    All other components within normal limits  PREGNANCY, URINE  CBC WITH DIFFERENTIAL/PLATELET  LIPASE, BLOOD    Imaging Review Ct Abdomen Pelvis W Contrast  10/10/2014   CLINICAL DATA:  Right lower quadrant pain, nausea, vomiting. Duration 2 days.  EXAM: CT ABDOMEN AND PELVIS WITH CONTRAST  TECHNIQUE: Multidetector CT imaging of the abdomen and pelvis was performed using the standard protocol following bolus administration of intravenous contrast.  CONTRAST:  OMNIPAQUE IOHEXOL 300 MG/ML  SOLN  COMPARISON:  06/01/2009  FINDINGS: The appendix is normal. It is seen to best advantage on the coronal reconstructions. Remainder of the bowel is unremarkable.  There is mild fatty infiltration of the liver without focal lesion. The gallbladder and bile ducts appear normal.  Pancreas, spleen, adrenals and kidneys are normal.  Uterus and ovaries are remarkable only for a partially collapsed peripherally enhancing 1.7 cm right ovarian cyst. There is a small volume free pelvic fluid.  No acute inflammatory changes are evident in the abdomen or pelvis.  There is no abnormality in the lower chest.  There is no significant musculoskeletal abnormality.  IMPRESSION: 1. Normal appendix 2. Mild fatty infiltration of the liver 3. Partially collapsed right ovarian cyst, probably physiologic corpus luteum. Small volume free pelvic fluid.   Electronically Signed   By: Ellery Plunk M.D.   On: 10/10/2014 22:37    11:20 PM No evidence of acute abdominal emergency She does have R. Ovarian cyst which could play role in paine though I doubt TOA/Torsion Pt still has some RLQ tenderness but appendix normal Pelvic exam unremarkable, I have low suspicion for acute GYN emergency She now admits to heavy lifting and thinks she may have strained abd wall Advised  NSAIDs We discussed strict return precautions   MDM   Final diagnoses:  RLQ abdominal pain    Nursing notes including past medical history and social history reviewed and considered in documentation Labs/vital reviewed myself and considered during evaluation    I personally performed the services described in this documentation, which was scribed in my presence. The recorded information has been reviewed and is accurate.      Amanda Rhine, MD 10/10/14 2322

## 2014-10-10 NOTE — ED Notes (Signed)
Pt states unable to void a this time-CCUA kit given

## 2014-10-13 ENCOUNTER — Emergency Department (HOSPITAL_BASED_OUTPATIENT_CLINIC_OR_DEPARTMENT_OTHER)
Admission: EM | Admit: 2014-10-13 | Discharge: 2014-10-13 | Disposition: A | Discharge status: Home / Self Care | Payer: Self-pay | Attending: Emergency Medicine | Admitting: Emergency Medicine

## 2014-10-13 ENCOUNTER — Encounter (HOSPITAL_BASED_OUTPATIENT_CLINIC_OR_DEPARTMENT_OTHER): Payer: Self-pay | Admitting: Emergency Medicine

## 2014-10-13 DIAGNOSIS — J449 Chronic obstructive pulmonary disease, unspecified: Secondary | ICD-10-CM | POA: Insufficient documentation

## 2014-10-13 DIAGNOSIS — R21 Rash and other nonspecific skin eruption: Secondary | ICD-10-CM | POA: Insufficient documentation

## 2014-10-13 DIAGNOSIS — Z720 Tobacco use: Secondary | ICD-10-CM | POA: Insufficient documentation

## 2014-10-13 DIAGNOSIS — Z87448 Personal history of other diseases of urinary system: Secondary | ICD-10-CM | POA: Insufficient documentation

## 2014-10-13 MED ORDER — VALACYCLOVIR HCL 1 G PO TABS: 1000.0000 mg | ORAL_TABLET | Freq: Three times a day (TID) | ORAL | Status: AC

## 2014-10-13 MED ORDER — HYDROCODONE-ACETAMINOPHEN 5-325 MG PO TABS: 1.0000 | ORAL_TABLET | ORAL | Status: DC | PRN

## 2014-10-13 NOTE — Discharge Instructions (Signed)
Take Valtrex 3 times daily for 1 week. Take Vicodin for severe pain only. No driving or operating heavy machinery while taking vicodin. This medication may cause drowsiness. Follow-up with your primary care physician in 2-3 days for recheck.  Rash A rash is a change in the color or texture of your skin. There are many different types of rashes. You may have other problems that accompany your rash. CAUSES   Infections.  Allergic reactions. This can include allergies to pets or foods.  Certain medicines.  Exposure to certain chemicals, soaps, or cosmetics.  Heat.  Exposure to poisonous plants.  Tumors, both cancerous and noncancerous. SYMPTOMS   Redness.  Scaly skin.  Itchy skin.  Dry or cracked skin.  Bumps.  Blisters.  Pain. DIAGNOSIS  Your caregiver may do a physical exam to determine what type of rash you have. A skin sample (biopsy) may be taken and examined under a microscope. TREATMENT  Treatment depends on the type of rash you have. Your caregiver may prescribe certain medicines. For serious conditions, you may need to see a skin doctor (dermatologist). HOME CARE INSTRUCTIONS   Avoid the substance that caused your rash.  Do not scratch your rash. This can cause infection.  You may take cool baths to help stop itching.  Only take over-the-counter or prescription medicines as directed by your caregiver.  Keep all follow-up appointments as directed by your caregiver. SEEK IMMEDIATE MEDICAL CARE IF:  You have increasing pain, swelling, or redness.  You have a fever.  You have new or severe symptoms.  You have body aches, diarrhea, or vomiting.  Your rash is not better after 3 days. MAKE SURE YOU:  Understand these instructions.  Will watch your condition.  Will get help right away if you are not doing well or get worse. Document Released: 03/22/2002 Document Revised: 06/24/2011 Document Reviewed: 01/14/2011 [facility] Patient Information 2015  [place]. This information is not intended to replace advice given to you by your health care provider. Make sure you discuss any questions you have with your health care provider. Shingles Shingles (herpes zoster) is an infection that is caused by the same virus that causes chickenpox (varicella). The infection causes a painful skin rash and fluid-filled blisters, which eventually break open, crust over, and heal. It may occur in any area of the body, but it usually affects only one side of the body or face. The pain of shingles usually lasts about 1 month. However, some people with shingles may develop long-term (chronic) pain in the affected area of the body. Shingles often occurs many years after the person had chickenpox. It is more common:  In people older than 50 years.  In people with weakened immune systems, such as those with HIV, AIDS, or cancer.  In people taking medicines that weaken the immune system, such as transplant medicines.  In people under great stress. CAUSES  Shingles is caused by the varicella zoster virus (VZV), which also causes chickenpox. After a person is infected with the virus, it can remain in the person's body for years in an inactive state (dormant). To cause shingles, the virus reactivates and breaks out as an infection in a nerve root. The virus can be spread from person to person (contagious) through contact with open blisters of the shingles rash. It will only spread to people who have not had chickenpox. When these people are exposed to the virus, they may develop chickenpox. They will not develop shingles. Once the blisters scab over,  the person is no longer contagious and cannot spread the virus to others. SIGNS AND SYMPTOMS  Shingles shows up in stages. The initial symptoms may be pain, itching, and tingling in an area of the skin. This pain is usually described as burning, stabbing, or throbbing.In a few days or weeks, a painful red rash will appear  in the area where the pain, itching, and tingling were felt. The rash is usually on one side of the body in a band or belt-like pattern. Then, the rash usually turns into fluid-filled blisters. They will scab over and dry up in approximately 2-3 weeks. Flu-like symptoms may also occur with the initial symptoms, the rash, or the blisters. These may include:  Fever.  Chills.  Headache.  Upset stomach. DIAGNOSIS  Your health care provider will perform a skin exam to diagnose shingles. Skin scrapings or fluid samples may also be taken from the blisters. This sample will be examined under a microscope or sent to a lab for further testing. TREATMENT  There is no specific cure for shingles. Your health care provider will likely prescribe medicines to help you manage the pain, recover faster, and avoid long-term problems. This may include antiviral drugs, anti-inflammatory drugs, and pain medicines. HOME CARE INSTRUCTIONS   Take a cool bath or apply cool compresses to the area of the rash or blisters as directed. This may help with the pain and itching.   Take medicines only as directed by your health care provider.   Rest as directed by your health care provider.  Keep your rash and blisters clean with mild soap and cool water or as directed by your health care provider.  Do not pick your blisters or scratch your rash. Apply an anti-itch cream or numbing creams to the affected area as directed by your health care provider.  Keep your shingles rash covered with a loose bandage (dressing).  Avoid skin contact with:  Babies.   Pregnant women.   Children with eczema.   Elderly people with transplants.   People with chronic illnesses, such as leukemia or AIDS.   Wear loose-fitting clothing to help ease the pain of material rubbing against the rash.  Keep all follow-up visits as directed by your health care provider.If the area involved is on your face, you may receive a  referral for a specialist, such as an eye doctor (ophthalmologist) or an ear, nose, and throat (ENT) doctor. Keeping all follow-up visits will help you avoid eye problems, chronic pain, or disability.  SEEK IMMEDIATE MEDICAL CARE IF:   You have facial pain, pain around the eye area, or loss of feeling on one side of your face.  You have ear pain or ringing in your ear.  You have loss of taste.  Your pain is not relieved with prescribed medicines.   Your redness or swelling spreads.   You have more pain and swelling.  Your condition is worsening or has changed.   You have a fever. MAKE SURE YOU:  Understand these instructions.  Will watch your condition.  Will get help right away if you are not doing well or get worse. Document Released: 04/01/2005 Document Revised: 08/16/2013 Document Reviewed: 11/14/2011 [facility] Patient Information 2015 [place], [place]. This information is not intended to replace advice given to you by your health care provider. Make sure you discuss any questions you have with your health care provider.

## 2014-10-13 NOTE — ED Provider Notes (Signed)
CSN: [id number]     Arrival date & time 10/13/14  1213 History   First MD Initiated Contact with Patient 10/13/14 1257     Chief Complaint  Patient presents with  . Rash     (Consider location/radiation/quality/duration/timing/severity/associated sxs/prior Treatment) HPI Comments: 44 year old female complaining of a rash to her left flank 2 days. States initially, there was some pain to the area, and she noticed a rash starting to gradually develop. The rash is burning and painful, not very itchy. States her close make the pain worse. No alleviating factors. Denies any new soaps, detergents, lotions or contacts with similar rash. Reports a history of shingles in the past and this feels the same. Denies fever, difficulty breathing or swallowing.  Patient is a 45 y.o. female presenting with rash. The history is provided by the patient.  Rash Location:  Torso Torso rash location:  L flank Quality: burning and painful   Pain details:    Severity:  Moderate   Onset quality:  Gradual   Duration:  2 days   Timing:  Constant   Progression:  Worsening Relieved by:  None tried Worsened by:  Contact Ineffective treatments:  None tried Associated symptoms: no fever and no shortness of breath     Past Medical History  Diagnosis Date  . Asthma   . COPD (chronic obstructive pulmonary disease)   . Endometriosis    Past Surgical History  Procedure Laterality Date  . Endometrial ablation     History reviewed. No pertinent family history. History  Substance Use Topics  . Smoking status: Current Every Day Smoker -- 1.00 packs/day    Types: Cigarettes  . Smokeless tobacco: Not on file  . Alcohol Use: No   OB History    No data available     Review of Systems  Constitutional: Negative for fever.  Respiratory: Negative for shortness of breath.   Skin: Positive for rash.  All other systems reviewed and are negative.     Allergies  Tramadol  Home Medications   Prior to  Admission medications   Medication Sig Start Date End Date Taking? Authorizing Provider  HYDROcodone-acetaminophen (NORCO/VICODIN) 5-325 MG per tablet Take 1-2 tablets by mouth every 4 (four) hours as needed. 10/13/14   Kathrynn Speed, PA-C  ibuprofen (ADVIL,MOTRIN) 600 MG tablet Take 1 tablet (600 mg total) by mouth every 6 (six) hours as needed. 10/10/14   Zadie Rhine, MD  oxyCODONE-acetaminophen (PERCOCET/ROXICET) 5-325 MG per tablet Take 1 tablet by mouth every 8 (eight) hours as needed for severe pain. 10/10/14   Zadie Rhine, MD  valACYclovir (VALTREX) 1000 MG tablet Take 1 tablet (1,000 mg total) by mouth 3 (three) times daily. 10/13/14 10/27/14  Alfonsa Vaile M Roby Donaway, PA-C   BP 112/91 mmHg  Pulse 87  Temp(Src) 98.6 F (37 C) (Oral)  Resp 20  SpO2 99%  LMP 09/23/2014 Physical Exam  Constitutional: She is oriented to person, place, and time. She appears well-developed and well-nourished. No distress.  HENT:  Head: Normocephalic and atraumatic.  Mouth/Throat: Oropharynx is clear and moist.  Eyes: Conjunctivae and EOM are normal.  Neck: Normal range of motion. Neck supple.  Cardiovascular: Normal rate, regular rhythm and normal heart sounds.   Pulmonary/Chest: Effort normal and breath sounds normal. No respiratory distress.  Abdominal: Soft. Bowel sounds are normal. There is no tenderness.  Musculoskeletal: Normal range of motion. She exhibits no edema.  Neurological: She is alert and oriented to person, place, and time. No sensory deficit.  Skin: Skin is warm and dry.  Cluster of erythematous papules with a few vesicles starting to develop on left flank. Tender. No secondary infection. No open lesions.  Psychiatric: She has a normal mood and affect. Her behavior is normal.  Nursing note and vitals reviewed.   ED Course  Procedures (including critical care time) Labs Review Labs Reviewed - No data to display  Imaging Review No results found.   EKG Interpretation None      MDM    Final diagnoses:  Rash   NAD. AF VSS. Rash has an appearance of early shingles. The rash is not itchy and is painful making this diagnosis most likely. Will treat with Valtrex and pain medicine. Advised follow-up with PCP within 2-3 days for recheck. Infection care/precautions discussed. Stable for discharge. Return precautions given. Patient states understanding of treatment care plan and is agreeable.  Kathrynn SpeedRobyn M Shermeka Rutt, PA-C 10/13/14 1319  Rolan BuccoMelanie Belfi, MD 10/13/14 1356

## 2014-10-13 NOTE — ED Notes (Addendum)
Pt in c/o painful rash to L abdomen and side. States hx of shingles in the same area and this feels the same.

## 2015-05-08 ENCOUNTER — Emergency Department (INDEPENDENT_AMBULATORY_CARE_PROVIDER_SITE_OTHER)
Admission: EM | Admit: 2015-05-08 | Discharge: 2015-05-08 | Disposition: A | Discharge status: Home / Self Care | Payer: Self-pay | Source: Home / Self Care | Attending: Family Medicine | Admitting: Family Medicine

## 2015-05-08 ENCOUNTER — Emergency Department (INDEPENDENT_AMBULATORY_CARE_PROVIDER_SITE_OTHER): Payer: Self-pay

## 2015-05-08 ENCOUNTER — Encounter: Payer: Self-pay | Admitting: *Deleted

## 2015-05-08 DIAGNOSIS — K760 Fatty (change of) liver, not elsewhere classified: Secondary | ICD-10-CM

## 2015-05-08 DIAGNOSIS — I700 Atherosclerosis of aorta: Secondary | ICD-10-CM

## 2015-05-08 DIAGNOSIS — R109 Unspecified abdominal pain: Secondary | ICD-10-CM

## 2015-05-08 DIAGNOSIS — R1010 Upper abdominal pain, unspecified: Secondary | ICD-10-CM

## 2015-05-08 DIAGNOSIS — R112 Nausea with vomiting, unspecified: Secondary | ICD-10-CM

## 2015-05-08 DIAGNOSIS — R1011 Right upper quadrant pain: Secondary | ICD-10-CM

## 2015-05-08 DIAGNOSIS — R1013 Epigastric pain: Secondary | ICD-10-CM

## 2015-05-08 HISTORY — DX: Irritable bowel syndrome, unspecified: K58.9

## 2015-05-08 HISTORY — DX: Pneumothorax, unspecified: J93.9

## 2015-05-08 LAB — POCT CBC W AUTO DIFF (K'VILLE URGENT CARE)

## 2015-05-08 LAB — POCT URINE PREGNANCY: Preg Test, Ur: NEGATIVE

## 2015-05-08 MED ORDER — OMEPRAZOLE 20 MG PO CPDR: 20.0000 mg | DELAYED_RELEASE_CAPSULE | Freq: Every day | ORAL | Status: DC

## 2015-05-08 MED ORDER — ONDANSETRON HCL 4 MG PO TABS: 4.0000 mg | ORAL_TABLET | Freq: Four times a day (QID) | ORAL | Status: DC

## 2015-05-08 MED ORDER — FAMOTIDINE 40 MG PO TABS
40.0000 mg | ORAL_TABLET | Freq: Once | ORAL | Status: AC
  Administered 2015-05-08: 40 mg via ORAL

## 2015-05-08 NOTE — ED Notes (Signed)
Pt c/o sudden burning epigastric pain 2 days ago after coughing and blowing her nose. Pain persisted since, worse with movement. No associated N/V.

## 2015-05-08 NOTE — ED Provider Notes (Signed)
CSN: [id number]     Arrival date & time 05/08/15  1305 History   First MD Initiated Contact with Patient 05/08/15 1401     Chief Complaint  Patient presents with  . Abdominal Pain    Epigastric   (Consider location/radiation/quality/duration/timing/severity/associated sxs/prior Treatment) HPI  Pt is a 45yo female presenting to [facility] with c/o severe epigastric and upper abdominal pain that started 2 days ago after coughing and blowing her nose.  Pt states she felt a burning ripping sensation that brought her to her knees but gradually subsided somewhat over the last 2 days.  Pt states pain is not as excruciating as it was initially, however, it is still present.  Pain is worse with certain movements.  She has been taking acetaminophen and ibuprofen w/o relief.  Reports hx of acid reflux but has not been taking medication for it. Pt states this does not feel like acid reflux.  Denies hx of similar pain. Pt does report drinking 4-5 beers on the weeks but denies hx of pancreatitis. Denies recent vomiting or diarrhea. Pt states she does have a hx of IBS so she has intermittent nausea and vomiting but denies blood in emesis last time she had vomiting. Denies fever or chills. Denies hx of abdominal surgeries.   Past Medical History  Diagnosis Date  . Asthma   . COPD (chronic obstructive pulmonary disease) (HCC)   . Endometriosis   . IBS (irritable bowel syndrome)   . Pneumothorax    Past Surgical History  Procedure Laterality Date  . Endometrial ablation     Family History  Problem Relation Age of Onset  . Cancer Mother     breast CA  . Diabetes Mother   . Cancer Father     breast CA, lymphoma  . Diabetes Father   . Cancer Sister    Social History  Substance Use Topics  . Smoking status: Current Every Day Smoker -- 1.00 packs/day    Types: Cigarettes  . Smokeless tobacco: None  . Alcohol Use: 2.4 oz/week    4 Cans of beer per week     Comment: weekends   OB History    No data  available     Review of Systems  Constitutional: Negative for fever and chills.  HENT: Positive for congestion and sneezing. Negative for rhinorrhea, sinus pressure, sore throat and voice change.   Respiratory: Positive for cough. Negative for shortness of breath.   Gastrointestinal: Positive for nausea, vomiting and abdominal pain. Negative for diarrhea, constipation and blood in stool.  Genitourinary: Negative for dysuria, frequency, hematuria and flank pain.  Musculoskeletal: Negative for myalgias, back pain and arthralgias.    Allergies  Tramadol  Home Medications   Prior to Admission medications   Medication Sig Start Date End Date Taking? Authorizing Provider  omeprazole (PRILOSEC) 20 MG capsule Take 1 capsule (20 mg total) by mouth daily. 05/08/15   Junius Finner, PA-C  ondansetron (ZOFRAN) 4 MG tablet Take 1 tablet (4 mg total) by mouth every 6 (six) hours. 05/08/15   Junius Finner, PA-C   Meds Ordered and Administered this Visit   Medications  famotidine (PEPCID) tablet 40 mg (40 mg Oral Given 05/08/15 1520)    BP 115/78 mmHg  Pulse 90  Temp(Src) 98.2 F (36.8 C) (Oral)  Resp 16  Ht  (1.626 m)  Wt 116 lb (52.617 kg)  BMI 19.90 kg/m2  SpO2 99%  LMP 04/30/2015 No data found.   Physical Exam  Constitutional: She  appears well-developed and well-nourished. No distress.  HENT:  Head: Normocephalic and atraumatic.  Eyes: Conjunctivae are normal. No scleral icterus.  Neck: Normal range of motion. Neck supple.  Cardiovascular: Normal rate, regular rhythm and normal heart sounds.   Pulmonary/Chest: Effort normal and breath sounds normal. No respiratory distress. She has no wheezes. She has no rales. She exhibits no tenderness.  Abdominal: Soft. Bowel sounds are normal. She exhibits no distension and no mass. There is tenderness. There is guarding. There is no rebound.  Soft, non-distended. Tenderness across upper abdomen. Worse in epigastric region with guarding.   Musculoskeletal: Normal range of motion.  Neurological: She is alert.  Skin: Skin is warm and dry. She is not diaphoretic.  Nursing note and vitals reviewed.   ED Course  Procedures (including critical care time)  Labs Review Labs Reviewed  COMPLETE METABOLIC PANEL WITH GFR  LIPASE  POCT URINE PREGNANCY  POCT CBC W AUTO DIFF ([facility])    Imaging Review Dg Chest 2 View  05/08/2015  CLINICAL DATA:  Mid upper abdominal pain after coughing on Saturday. EXAM: CHEST  2 VIEW COMPARISON:  04/15/2014 FINDINGS: The heart size and mediastinal contours are within normal limits. Both lungs are clear. The visualized skeletal structures are unremarkable. IMPRESSION: No active cardiopulmonary disease. Electronically Signed   By: Elige Ko   On: 05/08/2015 15:24   US Abdomen Complete  05/08/2015  CLINICAL DATA:  45 year old presenting with 4 day history of epigastric abdominal pain associated with nausea. Current history of irritable bowel syndrome. Personal history of anorexia. EXAM: ABDOMEN ULTRASOUND COMPLETE COMPARISON:  CT abdomen and pelvis 10/10/2014, 06/01/2009. FINDINGS: Gallbladder: No shadowing gallstones or echogenic sludge. No gallbladder wall thickening or pericholecystic fluid. Negative sonographic Murphy sign according to the ultrasound technologist. Common bile duct: Diameter: Approximately 2 mm. Liver: Scattered areas of focally increased and coarsened echotexture in both hepatic lobes. No parenchymal masses. Patent portal vein with hepatopetal flow. IVC: Patent. Pancreas: While difficult to visualize in its entirety, visualized portions normal in appearance. Spleen: Normal size and echotexture without focal parenchymal abnormality. Right Kidney: Length: Approximately 10.0 cm. No hydronephrosis. Well-preserved cortex. No shadowing calculi. Normal parenchymal echotexture. No focal parenchymal abnormality. Left Kidney: Length: Approximately 9.5 cm. No hydronephrosis.  Well-preserved cortex. No shadowing calculi. Normal parenchymal echotexture. No focal parenchymal abnormality. Abdominal aorta: Normal in caliber throughout its visualized course in the abdomen with mild atherosclerosis as noted on the prior CT. Maximum diameter 1.8 cm. Other findings: None. IMPRESSION: 1. Scattered areas of focal hepatic steatosis. No evidence of hepatic mass. 2. No evidence of cholelithiasis or cholecystitis. 3. Mild abdominal aortic atherosclerosis as noted on prior CT examinations, somewhat advanced for age. 4. Otherwise normal examination. Electronically Signed   By: Hulan Saas M.D.   On: 05/08/2015 15:19   Dg Abd 2 Views  05/08/2015  CLINICAL DATA:  Mid upper abdominal pain after coughing on Saturday EXAM: ABDOMEN - 2 VIEW COMPARISON:  CT 10/10/2014 FINDINGS: The bowel gas pattern is normal. There is no evidence of free air. No radio-opaque calculi or other significant radiographic abnormality is seen. Visualized lung bases clear. Heart is normal size. No acute bony abnormality. IMPRESSION: Negative. Electronically Signed   By: Charlett Nose M.D.   On: 05/08/2015 15:24       MDM   1. Fatty liver   2. Epigastric pain   3. RUQ pain   4. Abdominal pain   5. Non-intractable vomiting with nausea, unspecified vomiting type  Pt c/o RUQ and epigastric pain since coughing and sneezing 2 days ago.  Pt does have tenderness in epigastrium with some guarding.  Vitals: WNL, pt is afebrile, HR- 90bpm, BP- 115/78 Low concern for surgical abdomen at this time.  Pain likely due to gastric ulcers/acid reflux.  U/S Abd: significant for Fatty Liver, no evidence of hepatic mass or cholelithiasis or cholecystitis  CXR and Abd KUB: normal. No evidence of free air.  Reassured pt of no evidence of free air, down rupture of anything at this time, especially 2 days out.  Symptoms of abdominal pain and nausea likely due to fatty liver. Rx: Omeprazole and Zofran. Encouraged to cut back on  alcohol or stop using all together.  Decrease intake of fatty fried foods. Encouraged to establish care with a PCP for further evaluation of fatty liver and recheck of labs and symptoms in several weeks. Discussed symptoms that warrant emergent care in the ED. Patient verbalized understanding and agreement with treatment plan.     Junius Finner, PA-C 05/08/15 1540

## 2015-05-09 ENCOUNTER — Telehealth: Payer: Self-pay | Admitting: Emergency Medicine

## 2015-05-09 LAB — COMPLETE METABOLIC PANEL WITH GFR
ALT: 8 U/L (ref 6–29)
AST: 14 U/L (ref 10–30)
Albumin: 4.2 g/dL (ref 3.6–5.1)
Alkaline Phosphatase: 94 U/L (ref 33–115)
BUN: 5 mg/dL — ABNORMAL LOW (ref 7–25)
CO2: 27 mmol/L (ref 20–31)
Calcium: 9.2 mg/dL (ref 8.6–10.2)
Chloride: 100 mmol/L (ref 98–110)
Creat: 0.58 mg/dL (ref 0.50–1.10)
GFR, Est African American: >89 mL/min (ref >=60)
GFR, Est Non African American: >89 mL/min (ref >=60)
Glucose, Bld: 90 mg/dL (ref 65–99)
Potassium: 4.8 mmol/L (ref 3.5–5.3)
Sodium: 136 mmol/L (ref 135–146)
Total Bilirubin: 0.5 mg/dL (ref 0.2–1.2)
Total Protein: 7.4 g/dL (ref 6.1–8.1)

## 2015-05-09 LAB — LIPASE: Lipase: 15 U/L (ref 7–60)

## 2017-11-16 ENCOUNTER — Encounter (HOSPITAL_BASED_OUTPATIENT_CLINIC_OR_DEPARTMENT_OTHER): Payer: Self-pay | Admitting: Emergency Medicine

## 2017-11-16 ENCOUNTER — Emergency Department (HOSPITAL_BASED_OUTPATIENT_CLINIC_OR_DEPARTMENT_OTHER): Payer: Self-pay

## 2017-11-16 ENCOUNTER — Emergency Department (HOSPITAL_BASED_OUTPATIENT_CLINIC_OR_DEPARTMENT_OTHER)
Admission: EM | Admit: 2017-11-16 | Discharge: 2017-11-17 | Disposition: A | Discharge status: Home / Self Care | Payer: Self-pay | Attending: Emergency Medicine | Admitting: Emergency Medicine

## 2017-11-16 ENCOUNTER — Other Ambulatory Visit: Payer: Self-pay

## 2017-11-16 DIAGNOSIS — J449 Chronic obstructive pulmonary disease, unspecified: Secondary | ICD-10-CM | POA: Insufficient documentation

## 2017-11-16 DIAGNOSIS — S300XXA Contusion of lower back and pelvis, initial encounter: Secondary | ICD-10-CM

## 2017-11-16 DIAGNOSIS — F17210 Nicotine dependence, cigarettes, uncomplicated: Secondary | ICD-10-CM | POA: Insufficient documentation

## 2017-11-16 DIAGNOSIS — Y9301 Activity, walking, marching and hiking: Secondary | ICD-10-CM | POA: Insufficient documentation

## 2017-11-16 DIAGNOSIS — Y929 Unspecified place or not applicable: Secondary | ICD-10-CM | POA: Insufficient documentation

## 2017-11-16 DIAGNOSIS — Y999 Unspecified external cause status: Secondary | ICD-10-CM | POA: Insufficient documentation

## 2017-11-16 DIAGNOSIS — W108XXA Fall (on) (from) other stairs and steps, initial encounter: Secondary | ICD-10-CM | POA: Insufficient documentation

## 2017-11-16 LAB — URINALYSIS, ROUTINE W REFLEX MICROSCOPIC
Bilirubin Urine: NEGATIVE
Glucose, UA: NEGATIVE mg/dL
Ketones, ur: NEGATIVE mg/dL
NITRITE: NEGATIVE
Protein, ur: NEGATIVE mg/dL
pH: 8 (ref 5.0–8.0)

## 2017-11-16 LAB — CBC WITH DIFFERENTIAL/PLATELET
Basophils Absolute: 0.1 10*3/uL (ref 0.0–0.1)
Basophils Relative: 1 %
Eosinophils Absolute: 0.3 10*3/uL (ref 0.0–0.7)
Eosinophils Relative: 3 %
HEMATOCRIT: 37.9 % (ref 36.0–46.0)
HEMOGLOBIN: 12.7 g/dL (ref 12.0–15.0)
LYMPHS ABS: 3.2 10*3/uL (ref 0.7–4.0)
LYMPHS PCT: 28 %
MCH: 31.8 pg (ref 26.0–34.0)
MCHC: 33.5 g/dL (ref 30.0–36.0)
MCV: 95 fL (ref 78.0–100.0)
MONO ABS: 0.9 10*3/uL (ref 0.1–1.0)
MONOS PCT: 8 %
NEUTROS ABS: 6.9 10*3/uL (ref 1.7–7.7)
NEUTROS PCT: 60 %
Platelets: 254 10*3/uL (ref 150–400)
RBC: 3.99 MIL/uL (ref 3.87–5.11)
RDW: 14.2 % (ref 11.5–15.5)
WBC: 11.4 10*3/uL — ABNORMAL HIGH (ref 4.0–10.5)

## 2017-11-16 LAB — COMPREHENSIVE METABOLIC PANEL
ALK PHOS: 90 U/L (ref 38–126)
ALT: 9 U/L (ref 0–44)
ANION GAP: 8 (ref 5–15)
AST: 14 U/L — ABNORMAL LOW (ref 15–41)
Albumin: 3.5 g/dL (ref 3.5–5.0)
BILIRUBIN TOTAL: 0.3 mg/dL (ref 0.3–1.2)
BUN: 12 mg/dL (ref 6–20)
CALCIUM: 8 mg/dL — AB (ref 8.9–10.3)
CO2: 28 mmol/L (ref 22–32)
Chloride: 103 mmol/L (ref 98–111)
Creatinine, Ser: 1.05 mg/dL — ABNORMAL HIGH (ref 0.44–1.00)
GFR calc Af Amer: >60 mL/min (ref >60)
Glucose, Bld: 93 mg/dL (ref 70–99)
POTASSIUM: 3.6 mmol/L (ref 3.5–5.1)
Sodium: 139 mmol/L (ref 135–145)
Total Protein: 6.5 g/dL (ref 6.5–8.1)

## 2017-11-16 LAB — URINALYSIS, MICROSCOPIC (REFLEX)

## 2017-11-16 LAB — PREGNANCY, URINE: Preg Test, Ur: NEGATIVE

## 2017-11-16 MED ORDER — HYDROMORPHONE HCL 1 MG/ML IJ SOLN
1.0000 mg | Freq: Once | INTRAMUSCULAR | Status: AC
  Administered 2017-11-16: 1 mg via INTRAVENOUS
  Filled 2017-11-16: qty 1

## 2017-11-16 MED ORDER — MORPHINE SULFATE (PF) 4 MG/ML IV SOLN
4.0000 mg | Freq: Once | INTRAVENOUS | Status: AC
  Administered 2017-11-16: 4 mg via INTRAVENOUS
  Filled 2017-11-16: qty 1

## 2017-11-16 MED ORDER — OXYCODONE-ACETAMINOPHEN 5-325 MG PO TABS: 1.0000 | ORAL_TABLET | Freq: Four times a day (QID) | ORAL | 0 refills | Status: DC | PRN

## 2017-11-16 MED ORDER — IOPAMIDOL (ISOVUE-300) INJECTION 61%
100.0000 mL | Freq: Once | INTRAVENOUS | Status: AC | PRN
  Administered 2017-11-16: 100 mL via INTRAVENOUS

## 2017-11-16 MED ORDER — SODIUM CHLORIDE 0.9 % IV BOLUS
1000.0000 mL | Freq: Once | INTRAVENOUS | Status: AC
  Administered 2017-11-16: 1000 mL via INTRAVENOUS

## 2017-11-16 NOTE — Discharge Instructions (Signed)
Take tylenol for pain   Take percocet for severe pain. Do not drive with it   Apply ice for swelling   Expect the bruising to travel down your leg   See your doctor  Return to ER if you have worse buttock pain, unable to walk.

## 2017-11-16 NOTE — ED Provider Notes (Signed)
[facility] EMERGENCY DEPARTMENT Provider Note   CSN: [id number] Arrival date & time: 11/16/17  1848     History   Chief Complaint Chief Complaint  Patient presents with  . Fall    HPI Amanda Floyd is a 47 y.o. female hx of COPD, IBS, here with fall. Patient tripped and fell on right buttock 2 days ago. She felt some soreness initially. She woke up this morning and noticed large bruise on the right buttock. She states that she has significant pain with walking but is able to walk. She is not on blood thinners and denies any other injuries.   The history is provided by the patient.    Past Medical History:  Diagnosis Date  . Asthma   . COPD (chronic obstructive pulmonary disease) (HCC)   . Endometriosis   . IBS (irritable bowel syndrome)   . Pneumothorax     There are no active problems to display for this patient.   Past Surgical History:  Procedure Laterality Date  . ENDOMETRIAL ABLATION       OB History   None      Home Medications    Prior to Admission medications   Medication Sig Start Date End Date Taking? Authorizing Provider  omeprazole (PRILOSEC) 20 MG capsule Take 1 capsule (20 mg total) by mouth daily. 05/08/15   Lurene Shadow, PA-C  ondansetron (ZOFRAN) 4 MG tablet Take 1 tablet (4 mg total) by mouth every 6 (six) hours. 05/08/15   Lurene Shadow, PA-C    Family History Family History  Problem Relation Age of Onset  . Cancer Mother        breast CA  . Diabetes Mother   . Cancer Father        breast CA, lymphoma  . Diabetes Father   . Cancer Sister     Social History Social History   Tobacco Use  . Smoking status: Current Every Day Smoker    Packs/day: 1.00    Types: Cigarettes  . Smokeless tobacco: Never Used  Substance Use Topics  . Alcohol use: Yes    Alcohol/week: 2.4 oz    Types: 4 Cans of beer per week    Comment: weekends  . Drug use: No     Allergies   Tramadol   Review of Systems Review of Systems    Musculoskeletal:       R buttock pain   All other systems reviewed and are negative.    Physical Exam Updated Vital Signs BP 112/76 (BP Location: Right Arm)   Pulse 76   Temp 98.4 F (36.9 C) (Oral)   Resp 16   Ht 5\' 4"  (1.626 m)   Wt 56.7 kg (125 lb)   SpO2 98%   BMI 21.46 kg/m   Physical Exam  Constitutional: She is oriented to person, place, and time. She appears well-developed and well-nourished.  HENT:  Head: Normocephalic and atraumatic.  Mouth/Throat: Oropharynx is clear and moist.  Eyes: Pupils are equal, round, and reactive to light. Conjunctivae and EOM are normal.  Neck: Normal range of motion. Neck supple.  Cardiovascular: Normal rate, regular rhythm and normal heart sounds.  Pulmonary/Chest: Effort normal and breath sounds normal. No stridor. No respiratory distress. She has no wheezes.  Abdominal: Soft. Bowel sounds are normal.  R buttock hematoma, no obvious active bleeding or abscess.   Musculoskeletal: Normal range of motion.  Able to range R hip   Neurological: She is alert and oriented  to person, place, and time.  Skin: Skin is warm. Capillary refill takes less than 2 seconds.  Psychiatric: She has a normal mood and affect.  Nursing note and vitals reviewed.    ED Treatments / Results  Labs (all labs ordered are listed, but only abnormal results are displayed) Labs Reviewed  CBC WITH DIFFERENTIAL/PLATELET - Abnormal; Notable for the following components:      Result Value   WBC 11.4 (*)    All other components within normal limits  COMPREHENSIVE METABOLIC PANEL - Abnormal; Notable for the following components:   Creatinine, Ser 1.05 (*)    Calcium 8.0 (*)    AST 14 (*)    All other components within normal limits  URINALYSIS, ROUTINE W REFLEX MICROSCOPIC - Abnormal; Notable for the following components:   Specific Gravity, Urine <1.005 (*)    Hgb urine dipstick MODERATE (*)    Leukocytes, UA TRACE (*)    All other components within normal  limits  URINALYSIS, MICROSCOPIC (REFLEX) - Abnormal; Notable for the following components:   Bacteria, UA MANY (*)    All other components within normal limits  PREGNANCY, URINE    EKG None  Radiology Ct Pelvis W Contrast  Result Date: 11/16/2017 CLINICAL DATA:  Larey Seat on steps yesterday, large RIGHT buttock bruise, abdominopelvic blunt trauma EXAM: CT PELVIS WITH CONTRAST TECHNIQUE: Multidetector CT imaging of the pelvis was performed using the standard protocol following the bolus administration of intravenous contrast. Sagittal and coronal MPR images reconstructed from axial data set. CONTRAST:  ISOVUE-300 IOPAMIDOL (ISOVUE-300) INJECTION 61% IV. No oral contrast. COMPARISON:  CT abdomen and pelvis 10/10/2014 FINDINGS: Urinary Tract:  Bladder and distal ureters unremarkable Bowel: Normal appendix. Visualized large and small bowel loops normal appearance. Vascular/Lymphatic: Atherosclerotic calcifications of aorta and iliac arteries. No adenopathy. Reproductive:  Unremarkable uterus and ovaries Other: No free air or free fluid. No hernia. Subcutaneous contusion and hematoma RIGHT buttock, largest focal collection 4.0 x 1.3 cm image 76. Musculoskeletal: Muscular planes symmetric. Osseous mineralization normal. No fractures. IMPRESSION: Contusion and small subcutaneous hematoma RIGHT buttock. No fractures or acute intrapelvic abnormalities. Electronically Signed   By: Ulyses Southward M.D.   On: 11/16/2017 23:17    Procedures Procedures (including critical care time)  Medications Ordered in ED Medications  HYDROmorphone (DILAUDID) injection 1 mg (has no administration in time range)  sodium chloride 0.9 % bolus 1,000 mL ( Intravenous Stopped 11/16/17 2217)  morphine 4 MG/ML injection 4 mg (4 mg Intravenous Given 11/16/17 2120)  HYDROmorphone (DILAUDID) injection 1 mg (1 mg Intravenous Given 11/16/17 2220)  iopamidol (ISOVUE-300) 61 % injection 100 mL (100 mLs Intravenous Contrast Given 11/16/17 2258)       Initial Impression / Assessment and Plan / ED Course  I have reviewed the triage vital signs and the nursing notes.  Pertinent labs & imaging results that were available during my care of the patient were reviewed by me and considered in my medical decision making (see chart for details).     Amanda Floyd is a 47 y.o. female here with fall with R buttock hematoma. Likely contusion and then developed hematoma. Able to range the right hip and able to ambulate afterwards so I have low suspicion for occult hip fracture.   11:45 PM Hg 12.7. CT showed subcutaneous hematoma but no underlying fractures. Pain controlled with pain meds. Will dc home with short course of percocet for pain. Recommend ice to buttock area.    Final Clinical Impressions(s) /  ED Diagnoses   Final diagnoses:  None    ED Discharge Orders    None       Charlynne PanderYao, Philander Ake Hsienta, MD 11/16/17 [id number]

## 2017-11-16 NOTE — ED Triage Notes (Signed)
Pt tripped and fell on the basement stairs 2 days ago. Large bruise to R buttock.

## 2017-11-17 NOTE — ED Notes (Signed)
Pt teaching provided on medications that may cause drowsiness. Pt instructed not to drive or operate heavy machinery while taking the prescribed medication. Pt verbalized understanding.   

## 2017-12-12 ENCOUNTER — Other Ambulatory Visit: Payer: Self-pay

## 2017-12-12 ENCOUNTER — Emergency Department (HOSPITAL_BASED_OUTPATIENT_CLINIC_OR_DEPARTMENT_OTHER)
Admission: EM | Admit: 2017-12-12 | Discharge: 2017-12-12 | Disposition: A | Discharge status: Home / Self Care | Payer: Self-pay | Attending: Emergency Medicine | Admitting: Emergency Medicine

## 2017-12-12 ENCOUNTER — Encounter (HOSPITAL_BASED_OUTPATIENT_CLINIC_OR_DEPARTMENT_OTHER): Payer: Self-pay | Admitting: Emergency Medicine

## 2017-12-12 DIAGNOSIS — S300XXD Contusion of lower back and pelvis, subsequent encounter: Secondary | ICD-10-CM | POA: Insufficient documentation

## 2017-12-12 DIAGNOSIS — Z79899 Other long term (current) drug therapy: Secondary | ICD-10-CM | POA: Insufficient documentation

## 2017-12-12 DIAGNOSIS — S300XXA Contusion of lower back and pelvis, initial encounter: Secondary | ICD-10-CM

## 2017-12-12 DIAGNOSIS — J45909 Unspecified asthma, uncomplicated: Secondary | ICD-10-CM | POA: Insufficient documentation

## 2017-12-12 DIAGNOSIS — X58XXXD Exposure to other specified factors, subsequent encounter: Secondary | ICD-10-CM | POA: Insufficient documentation

## 2017-12-12 DIAGNOSIS — F17210 Nicotine dependence, cigarettes, uncomplicated: Secondary | ICD-10-CM | POA: Insufficient documentation

## 2017-12-12 MED ORDER — HYDROCODONE-ACETAMINOPHEN 5-325 MG PO TABS: 1.0000 | ORAL_TABLET | ORAL | 0 refills | Status: DC | PRN

## 2017-12-12 NOTE — Discharge Instructions (Signed)
Follow-up with orthopedics for further management and treatment.

## 2017-12-12 NOTE — ED Triage Notes (Signed)
States fell on the 4th of this month and was eval here for same. Continues to have a knot on right buttock with a shooting pain that goes up her back.

## 2017-12-12 NOTE — ED Notes (Signed)
Up to BR, gait steady 

## 2017-12-12 NOTE — ED Provider Notes (Signed)
[facility] EMERGENCY DEPARTMENT Provider Note   CSN: [id number] Arrival date & time: 12/12/17  [id number]     History   Chief Complaint Chief Complaint  Patient presents with  . Back Pain    HPI Amanda Floyd is a 47 y.o. female.  HPI Patient presents with continued pain in her right buttock area.  Around 3 weeks ago had a fall down the stairs and had a hematoma in her right buttock area.  Seen in the ER had CT scan done.  Bruising has decreased but now has continued to change to pain.  Still on right buttock.  Does not radiate down the leg.  Difficulty sitting due to positioning.  Difficulty laying down due to pain in the back area.  No numbness or weakness.  No loss of bladder or bowel control.  No relief with Tylenol and Motrin.  No fevers. Past Medical History:  Diagnosis Date  . Asthma   . COPD (chronic obstructive pulmonary disease) (HCC)   . Endometriosis   . IBS (irritable bowel syndrome)   . Pneumothorax     There are no active problems to display for this patient.   Past Surgical History:  Procedure Laterality Date  . ENDOMETRIAL ABLATION       OB History   None      Home Medications    Prior to Admission medications   Medication Sig Start Date End Date Taking? Authorizing Provider  ketorolac (TORADOL) 10 MG tablet Take by mouth. 06/08/17  Yes [provider]  HYDROcodone-acetaminophen (NORCO/VICODIN) 5-325 MG tablet Take 1-2 tablets by mouth every 4 (four) hours as needed. 12/12/17   Benjiman CorePickering, Shanigua Gibb, MD  omeprazole (PRILOSEC) 20 MG capsule Take 1 capsule (20 mg total) by mouth daily. 05/08/15   Lurene ShadowPhelps, Erin O, PA-C  ondansetron (ZOFRAN) 4 MG tablet Take 1 tablet (4 mg total) by mouth every 6 (six) hours. 05/08/15   Lurene ShadowPhelps, Erin O, PA-C  oxyCODONE-acetaminophen (PERCOCET) 5-325 MG tablet Take 1 tablet by mouth every 6 (six) hours as needed. Patient taking differently: Take 1 tablet by mouth every 6 (six) hours as needed (Has taken all  doses).  11/16/17   Charlynne PanderYao, David Hsienta, MD    Family History Family History  Problem Relation Age of Onset  . Cancer Mother        breast CA  . Diabetes Mother   . Cancer Father        breast CA, lymphoma  . Diabetes Father   . Cancer Sister     Social History Social History   Tobacco Use  . Smoking status: Current Every Day Smoker    Packs/day: 1.00    Types: Cigarettes  . Smokeless tobacco: Never Used  Substance Use Topics  . Alcohol use: Yes    Alcohol/week: 4.0 standard drinks    Types: 4 Cans of beer per week    Comment: weekends  . Drug use: No     Allergies   Tramadol   Review of Systems Review of Systems  HENT: Negative for congestion.   Respiratory: Negative for shortness of breath.   Gastrointestinal: Negative for abdominal pain.  Genitourinary: Negative for flank pain.  Musculoskeletal: Positive for back pain. Negative for neck stiffness.  Skin: Negative for wound.  Neurological: Negative for weakness and numbness.     Physical Exam Updated Vital Signs BP 112/79 (BP Location: Left Arm)   Pulse 76   Temp 98.2 F (36.8 C) (Oral)   Resp 16  Ht 5\' 4"  (1.626 m)   Wt 56.7 kg   SpO2 98%   BMI 21.46 kg/m   Physical Exam  Constitutional: She appears well-developed and well-nourished.  HENT:  Head: Atraumatic.  Neck: Neck supple.  Cardiovascular: Normal rate.  Pulmonary/Chest: No respiratory distress.  Abdominal: There is no tenderness.  Musculoskeletal: She exhibits tenderness.  Tenderness swelling right superior medial buttock area.  Some firmness.  No erythema.  Good range of motion in hip.  Neurovascular intact distally.  Pelvis is stable.  Neurological: She is alert.  Skin: Skin is warm.     ED Treatments / Results  Labs (all labs ordered are listed, but only abnormal results are displayed) Labs Reviewed - No data to display  EKG None  Radiology No results found.  Procedures Procedures (including critical care  time)  Medications Ordered in ED Medications - No data to display   Initial Impression / Assessment and Plan / ED Course  I have reviewed the triage vital signs and the nursing notes.  Pertinent labs & imaging results that were available during my care of the patient were reviewed by me and considered in my medical decision making (see chart for details).     Patient with buttock pain.  Recent hematoma after fall.  Likely this is evolution of the hematoma.  However with continued pain that is limiting her lifestyle and activities of think it would be worth following up with orthopedic surgery  Short course of pain medicine given.  Drug database reviewed.  Discharge home.  Final Clinical Impressions(s) / ED Diagnoses   Final diagnoses:  Traumatic hematoma of buttock, initial encounter    ED Discharge Orders         Ordered    HYDROcodone-acetaminophen (NORCO/VICODIN) 5-325 MG tablet  Every 4 hours PRN,   Status:  Discontinued     12/12/17 1017    HYDROcodone-acetaminophen (NORCO/VICODIN) 5-325 MG tablet  Every 4 hours PRN     12/12/17 1025           Benjiman Core, MD 12/12/17 1027

## 2017-12-12 NOTE — ED Notes (Signed)
ED Provider at bedside. 

## 2017-12-29 ENCOUNTER — Ambulatory Visit (INDEPENDENT_AMBULATORY_CARE_PROVIDER_SITE_OTHER): Payer: Self-pay | Admitting: Orthopaedic Surgery

## 2018-05-10 ENCOUNTER — Other Ambulatory Visit: Payer: Self-pay

## 2018-05-10 ENCOUNTER — Emergency Department (HOSPITAL_BASED_OUTPATIENT_CLINIC_OR_DEPARTMENT_OTHER): Payer: Self-pay

## 2018-05-10 ENCOUNTER — Encounter (HOSPITAL_BASED_OUTPATIENT_CLINIC_OR_DEPARTMENT_OTHER): Payer: Self-pay | Admitting: Emergency Medicine

## 2018-05-10 ENCOUNTER — Emergency Department (HOSPITAL_BASED_OUTPATIENT_CLINIC_OR_DEPARTMENT_OTHER)
Admission: EM | Admit: 2018-05-10 | Discharge: 2018-05-10 | Disposition: A | Discharge status: Home / Self Care | Payer: Self-pay | Attending: Emergency Medicine | Admitting: Emergency Medicine

## 2018-05-10 DIAGNOSIS — Z79899 Other long term (current) drug therapy: Secondary | ICD-10-CM | POA: Insufficient documentation

## 2018-05-10 DIAGNOSIS — R102 Pelvic and perineal pain: Secondary | ICD-10-CM

## 2018-05-10 DIAGNOSIS — R1030 Lower abdominal pain, unspecified: Secondary | ICD-10-CM | POA: Insufficient documentation

## 2018-05-10 DIAGNOSIS — F17210 Nicotine dependence, cigarettes, uncomplicated: Secondary | ICD-10-CM | POA: Insufficient documentation

## 2018-05-10 DIAGNOSIS — J449 Chronic obstructive pulmonary disease, unspecified: Secondary | ICD-10-CM | POA: Insufficient documentation

## 2018-05-10 LAB — COMPREHENSIVE METABOLIC PANEL
ALBUMIN: 3.8 g/dL (ref 3.5–5.0)
ALT: 9 U/L (ref 0–44)
AST: 13 U/L — AB (ref 15–41)
Alkaline Phosphatase: 99 U/L (ref 38–126)
Anion gap: 8 (ref 5–15)
BILIRUBIN TOTAL: 0.4 mg/dL (ref 0.3–1.2)
BUN: 10 mg/dL (ref 6–20)
CO2: 25 mmol/L (ref 22–32)
CREATININE: 0.78 mg/dL (ref 0.44–1.00)
Calcium: 9.2 mg/dL (ref 8.9–10.3)
Chloride: 105 mmol/L (ref 98–111)
GFR calc Af Amer: >60 mL/min (ref >60)
GLUCOSE: 110 mg/dL — AB (ref 70–99)
Potassium: 3.7 mmol/L (ref 3.5–5.1)
Sodium: 138 mmol/L (ref 135–145)
TOTAL PROTEIN: 7.3 g/dL (ref 6.5–8.1)

## 2018-05-10 LAB — CBC WITH DIFFERENTIAL/PLATELET
ABS IMMATURE GRANULOCYTES: 0.04 10*3/uL (ref 0.00–0.07)
BASOS PCT: 1 %
Basophils Absolute: 0.1 10*3/uL (ref 0.0–0.1)
EOS PCT: 3 %
Eosinophils Absolute: 0.2 10*3/uL (ref 0.0–0.5)
HCT: 44.6 % (ref 36.0–46.0)
HEMOGLOBIN: 14.5 g/dL (ref 12.0–15.0)
Immature Granulocytes: 1 %
LYMPHS PCT: 25 %
Lymphs Abs: 2.2 10*3/uL (ref 0.7–4.0)
MCH: 31.2 pg (ref 26.0–34.0)
MCHC: 32.5 g/dL (ref 30.0–36.0)
MCV: 95.9 fL (ref 80.0–100.0)
MONO ABS: 0.6 10*3/uL (ref 0.1–1.0)
MONOS PCT: 7 %
NEUTROS ABS: 5.4 10*3/uL (ref 1.7–7.7)
Neutrophils Relative %: 63 %
Platelets: 334 10*3/uL (ref 150–400)
RBC: 4.65 MIL/uL (ref 3.87–5.11)
RDW: 13.3 % (ref 11.5–15.5)
WBC: 8.6 10*3/uL (ref 4.0–10.5)
nRBC: 0 % (ref 0.0–0.2)

## 2018-05-10 LAB — URINALYSIS, ROUTINE W REFLEX MICROSCOPIC
GLUCOSE, UA: NEGATIVE mg/dL
Ketones, ur: NEGATIVE mg/dL
NITRITE: NEGATIVE
PH: 7 (ref 5.0–8.0)
Protein, ur: NEGATIVE mg/dL
SPECIFIC GRAVITY, URINE: 1.01 (ref 1.005–1.030)

## 2018-05-10 LAB — URINALYSIS, MICROSCOPIC (REFLEX)

## 2018-05-10 LAB — WET PREP, GENITAL
Clue Cells Wet Prep HPF POC: NONE SEEN
Sperm: NONE SEEN
Trich, Wet Prep: NONE SEEN
Yeast Wet Prep HPF POC: NONE SEEN

## 2018-05-10 LAB — LIPASE, BLOOD: LIPASE: 22 U/L (ref 11–51)

## 2018-05-10 LAB — OCCULT BLOOD X 1 CARD TO LAB, STOOL: Fecal Occult Bld: NEGATIVE

## 2018-05-10 LAB — PREGNANCY, URINE: PREG TEST UR: NEGATIVE

## 2018-05-10 MED ORDER — IOPAMIDOL (ISOVUE-300) INJECTION 61%
100.0000 mL | Freq: Once | INTRAVENOUS | Status: AC | PRN
  Administered 2018-05-10: 100 mL via INTRAVENOUS

## 2018-05-10 MED ORDER — CEFTRIAXONE SODIUM 250 MG IJ SOLR
250.0000 mg | Freq: Once | INTRAMUSCULAR | Status: AC
  Administered 2018-05-10: 250 mg via INTRAMUSCULAR
  Filled 2018-05-10: qty 250

## 2018-05-10 MED ORDER — ONDANSETRON HCL 4 MG/2ML IJ SOLN
4.0000 mg | Freq: Once | INTRAMUSCULAR | Status: AC
  Administered 2018-05-10: 4 mg via INTRAVENOUS
  Filled 2018-05-10: qty 2

## 2018-05-10 MED ORDER — SODIUM CHLORIDE 0.9 % IV BOLUS
1000.0000 mL | Freq: Once | INTRAVENOUS | Status: AC
  Administered 2018-05-10: 1000 mL via INTRAVENOUS

## 2018-05-10 MED ORDER — DOXYCYCLINE HYCLATE 100 MG PO CAPS: 100.0000 mg | ORAL_CAPSULE | Freq: Two times a day (BID) | ORAL | 0 refills | Status: AC

## 2018-05-10 MED ORDER — NAPROXEN 500 MG PO TABS: 500.0000 mg | ORAL_TABLET | Freq: Two times a day (BID) | ORAL | 0 refills | Status: DC

## 2018-05-10 MED ORDER — ACETAMINOPHEN 325 MG PO TABS
650.0000 mg | ORAL_TABLET | Freq: Once | ORAL | Status: AC
  Administered 2018-05-10: 650 mg via ORAL
  Filled 2018-05-10: qty 2

## 2018-05-10 MED ORDER — KETOROLAC TROMETHAMINE 30 MG/ML IJ SOLN
30.0000 mg | Freq: Once | INTRAMUSCULAR | Status: AC
  Administered 2018-05-10: 30 mg via INTRAVENOUS
  Filled 2018-05-10: qty 1

## 2018-05-10 NOTE — Discharge Instructions (Addendum)
You have been seen today for abdominal pain. Please read and follow all provided instructions.   1. Medications: naproxen for pain, doxycycline (antibiotic) usual home medications 2. Treatment: rest, drink plenty of fluids 3. Follow Up: Please follow up with your primary doctor and OBGYN in 2 days for discussion of your diagnoses and further evaluation after today's visit; if you do not have a primary care doctor use the resource guide provided to find one; Please return to the ER for any new or worsening symptoms. Please obtain all of your results from medical records or have your doctors office obtain the results - share them with your doctor - you should be seen at your doctors office. Call today to arrange your follow up.   Take medications as prescribed. Please review all of the medicines and only take them if you do not have an allergy to them. Return to the emergency room for worsening condition or new concerning symptoms. Follow up with your regular doctor. If you don't have a regular doctor use one of the numbers below to establish a primary care doctor.  Please be aware that if you are taking birth control pills, taking other prescriptions, ESPECIALLY ANTIBIOTICS may make the birth control ineffective - if this is the case, either do not engage in sexual activity or use alternative methods of birth control such as condoms until you have finished the medicine and your family doctor says it is OK to restart them. If you are on a blood thinner such as COUMADIN, be aware that any other medicine that you take may cause the coumadin to either work too much, or not enough - you should have your coumadin level rechecked in next 7 days if this is the case.  ?  It is also a possibility that you have an allergic reaction to any of the medicines that you have been prescribed - Everybody reacts differently to medications and while MOST people have no trouble with most medicines, you may have a reaction such  as nausea, vomiting, rash, swelling, shortness of breath. If this is the case, please stop taking the medicine immediately and contact your physician.  ?  You should return to the ER if you develop severe or worsening symptoms.   Emergency Department Resource Guide 1) Find a Doctor and Pay Out of Pocket Although you won't have to find out who is covered by your insurance plan, it is a good idea to ask around and get recommendations. You will then need to call the office and see if the doctor you have chosen will accept you as a new patient and what types of options they offer for patients who are self-pay. Some doctors offer discounts or will set up payment plans for their patients who do not have insurance, but you will need to ask so you aren't surprised when you get to your appointment.  2) Contact Your Local Health Department Not all health departments have doctors that can see patients for sick visits, but many do, so it is worth a call to see if yours does. If you don't know where your local health department is, you can check in your phone book. The CDC also has a tool to help you locate your state's health department, and many state websites also have listings of all of their local health departments.  3) Find a Walk-in Clinic If your illness is not likely to be very severe or complicated, you may want to try a walk in  clinic. These are popping up all over the country in pharmacies, drugstores, and shopping centers. They're usually staffed by nurse practitioners or physician assistants that have been trained to treat common illnesses and complaints. They're usually fairly quick and inexpensive. However, if you have serious medical issues or chronic medical problems, these are probably not your best option.  No Primary Care Doctor: Call [facility] Connect at  [phone] - they can help you locate a primary care doctor that  accepts your insurance, provides certain services, etc. Physician Referral  [phone]  Emergency Department Resource Guide 1) Find a Doctor and Pay Out of Pocket Although you won't have to find out who is covered by your insurance plan, it is a good idea to ask around and get recommendations. You will then need to call the office and see if the doctor you have chosen will accept you as a new patient and what types of options they offer for patients who are self-pay. Some doctors offer discounts or will set up payment plans for their patients who do not have insurance, but you will need to ask so you aren't surprised when you get to your appointment.  2) Contact Your Local Health Department Not all health departments have doctors that can see patients for sick visits, but many do, so it is worth a call to see if yours does. If you don't know where your local health department is, you can check in your phone book. The CDC also has a tool to help you locate your state's health department, and many state websites also have listings of all of their local health departments.  3) Find a [facility] If your illness is not likely to be very severe or complicated, you may want to try a walk in clinic. These are popping up all over the country in pharmacies, drugstores, and shopping centers. They're usually staffed by nurse practitioners or physician assistants that have been trained to treat common illnesses and complaints. They're usually fairly quick and inexpensive. However, if you have serious medical issues or chronic medical problems, these are probably not your best option.  No Primary Care Doctor: Call Health Connect at  [phone] - they can help you locate a primary care doctor that  accepts your insurance, provides certain services, etc. Physician Referral Service- [phone]  Chronic Pain Problems: Organization         Address  Phone   Notes  [facility]  [phone] Patients need to be referred by their primary care doctor.    Medication Assistance: Organization         Address  Phone   Notes  [facility] [address] [place], [place] [address] [phone] --Must be a resident of [facility] -- Must have NO insurance coverage whatsoever (no Medicaid/ Medicare, etc.) -- The pt. MUST have a primary care doctor that directs their care regularly and follows them in the community   [facility]  [phone]   [employer]  [phone]    Agencies that provide inexpensive medical care: Organization         Address  Phone   Notes  [place]  [phone]   [facility]    [phone]   [facility] [place], [place] [address] [phone]   [place] [address] [place] [phone]   [facility]anned [facility]    (  [phone]   [facility]    [phone]   [facility]  [address], [place] Phone:  [phone], Fax:  [phone] Hours of Operation:  9 am - 6 pm, M-F.  Also accepts Medicaid/Medicare and self-pay.  [facility] for [place] [address], [address], [place] Phone: [phone], Fax: [phone]. Hours of Operation:  8:30 am - 5:30 pm, M-F.  Also accepts Medicaid and self-pay.  [facility] [place] [address], [place] Phone: [phone]   [place], [place], [place] [phone], Ext. [phone] Mondays & Thursdays: 7-9 AM.  First 15 patients are seen on a first come, first serve basis.    [place] Providers:  Organization         Address  Phone   Notes  [facility] [address], [place] [phone] Also accepts self-pay patients.  [facility] [address], [place]  [phone]   [place], [address], [place] [phone]   [facility] [address] [place] [phone]   Lucianne Lei [address], [place]   [phone] Only accepts [place] [facility] [place] patients after they have their name applied to their card.   Self-Pay (no insurance) in [facility]:  Organization         Address  Phone   Notes  Sickle Cell Patients, [facility] [place] [phone]   [facility] [place] [phone]   [facility] [place]  [place], [place], [place] [phone]   [facility]/Dr. Osei-Bonsu  [address], [place] or [address], [place] [phone] Phone number for both [place] and [place] locations is the same.  [facility] [address], [place] [phone]   [facility] [address] [place] or [address] [phone]   [facility] [address] [place] [phone], phone; [phone], fax Sees patients 1st and 3rd Saturday of every month.  Must not qualify for public or private insurance (i.e. Medicaid, Medicare, [place] [employer] Choice, Veterans' Benefits)  Household income should be no more than 200% of the poverty level The clinic cannot treat you if you are pregnant or think you are pregnant  Sexually transmitted diseases are not treated at the clinic.

## 2018-05-10 NOTE — ED Triage Notes (Signed)
Patient states that she is having intermittent pelvic pain since last night - Patient states she has endometriosis

## 2018-05-10 NOTE — ED Notes (Signed)
Patient transported to CT 

## 2018-05-10 NOTE — ED Provider Notes (Signed)
[facility] EMERGENCY DEPARTMENT Provider Note   CSN: [id number] Arrival date & time: 05/10/18  1012   History   Chief Complaint Chief Complaint  Patient presents with  . Pelvic Pain    HPI Amanda Floyd is a 48 y.o. female with a PMH of Asthma, IBS, and endometriosis presenting with pelvic pain, worse in the suprapubic region onset last night. Patient describes pain as a pulling sensation and states it is better with laying down on her right side. Patient states pain radiates to the right side of her back. Patient states nothing makes pain better. Patient reports she took ibuprofen and gabapentin without relief this morning. Patient reports nausea, but denies vomiting, constipation, or diarrhea. Last BM was yesterday. Patient reports she had a soft BM with a small amount of blood 3 days ago. Patient states last colonoscopy was 2 years ago and it was normal. Patient denies any other changes in her BMs. LMP in July 2019 and states she is perimenopausal. Patient reports she is sexually active. Patient states this pain is worse than her endometriosis pain. Patient states her endometriosis has worsened over the last month and [facility]. Patient states she has discussed surgery with OBGYN, but has not scheduled surgery yet. Patient reports ovarian cysts and kidney stones in the past. Patient reports being diagnosed with an over active bladder and states she has urinary frequency at baseline. Patient denies any new sexual partners and states she is only sexually active with person. Patient reports a history of PID a few years ago. Patient denies a hx of STIs. Patient denies vaginal discharge, lesions, vaginal bleeding, fever, or dysuria.   HPI  Past Medical History:  Diagnosis Date  . Asthma   . COPD (chronic obstructive pulmonary disease) (HCC)   . Endometriosis   . IBS (irritable bowel syndrome)   . Pneumothorax     There are no active problems  to display for this patient.   Past Surgical History:  Procedure Laterality Date  . ENDOMETRIAL ABLATION       OB History   No obstetric history on file.      Home Medications    Prior to Admission medications   Medication Sig Start Date End Date Taking? Authorizing Provider  gabapentin (NEURONTIN) 100 MG capsule Take by mouth. 01/02/18  Yes [provider]  ibuprofen (ADVIL,MOTRIN) 800 MG tablet Take by mouth. 09/11/17  Yes [provider]  ALBUTEROL SULFATE HFA IN Inhale into the lungs.    [provider]  doxycycline (VIBRAMYCIN) 100 MG capsule Take 1 capsule (100 mg total) by mouth 2 (two) times daily for 14 days. 05/10/18 05/24/18  Carlyle BasquesHernandez, Oval Cavazos P, PA-C  HYDROcodone-acetaminophen (NORCO/VICODIN) 5-325 MG tablet Take 1-2 tablets by mouth every 4 (four) hours as needed. 12/12/17   Benjiman CorePickering, Nathan, MD  ketorolac (TORADOL) 10 MG tablet Take by mouth. 06/08/17   [provider]  naproxen (NAPROSYN) 500 MG tablet Take 1 tablet (500 mg total) by mouth 2 (two) times daily. 05/10/18   Carlyle BasquesHernandez, Jasman Pfeifle P, PA-C  omeprazole (PRILOSEC) 20 MG capsule Take 1 capsule (20 mg total) by mouth daily. 05/08/15   Lurene ShadowPhelps, Erin O, PA-C  ondansetron (ZOFRAN) 4 MG tablet Take 1 tablet (4 mg total) by mouth every 6 (six) hours. 05/08/15   Lurene ShadowPhelps, Erin O, PA-C  oxyCODONE-acetaminophen (PERCOCET) 5-325 MG tablet Take 1 tablet by mouth every 6 (six) hours as needed. Patient taking differently: Take 1  tablet by mouth every 6 (six) hours as needed (Has taken all doses).  11/16/17   Charlynne Pander, MD    Family History Family History  Problem Relation Age of Onset  . Cancer Mother        breast CA  . Diabetes Mother   . Cancer Father        breast CA, lymphoma  . Diabetes Father   . Cancer Sister     Social History Social History   Tobacco Use  . Smoking status: Current Every Day Smoker    Packs/day: 1.00    Types: Cigarettes  . Smokeless tobacco: Never Used    Substance Use Topics  . Alcohol use: Yes    Alcohol/week: 4.0 standard drinks    Types: 4 Cans of beer per week    Comment: weekends  . Drug use: No     Allergies   Tramadol   Review of Systems Review of Systems  Constitutional: Negative for activity change, appetite change, chills, fever and unexpected weight change.  HENT: Negative for congestion, rhinorrhea and sore throat.   Eyes: Negative for visual disturbance.  Respiratory: Negative for cough and shortness of breath.   Cardiovascular: Negative for chest pain.  Gastrointestinal: Positive for abdominal pain and nausea. Negative for constipation, diarrhea and vomiting.  Endocrine: Negative for polydipsia, polyphagia and polyuria.  Genitourinary: Positive for frequency and pelvic pain. Negative for decreased urine volume, dysuria, flank pain, genital sores, hematuria, urgency, vaginal bleeding, vaginal discharge and vaginal pain.  Musculoskeletal: Negative for back pain.  Skin: Negative for rash.  Allergic/Immunologic: Negative for immunocompromised state.  Neurological: Negative for weakness and light-headedness.  Psychiatric/Behavioral: The patient is not nervous/anxious.     Physical Exam Updated Vital Signs BP 103/66 (BP Location: Right Arm)   Pulse (!) 54   Temp (!) 97.5 F (36.4 C) (Oral)   Resp 16   Ht 5\' 4"  (1.626 m)   Wt 63.5 kg   LMP 11/03/2017   SpO2 99%   BMI 24.03 kg/m   Physical Exam Vitals signs and nursing note reviewed. Exam conducted with a chaperone present.  Constitutional:      General: She is not in acute distress.    Appearance: She is well-developed. She is not diaphoretic.  HENT:     Head: Normocephalic and atraumatic.  Neck:     Musculoskeletal: Normal range of motion and neck supple.  Cardiovascular:     Rate and Rhythm: Normal rate and regular rhythm.     Heart sounds: Normal heart sounds. No murmur. No friction rub. No gallop.   Pulmonary:     Effort: Pulmonary effort is  normal. No respiratory distress.     Breath sounds: Normal breath sounds. No wheezing or rales.  Abdominal:     General: Bowel sounds are normal. There is no distension.     Palpations: Abdomen is soft. Abdomen is not rigid. There is no mass.     Tenderness: There is abdominal tenderness in the suprapubic area. There is no right CVA tenderness, left CVA tenderness, guarding or rebound. Negative signs include Murphy's sign and McBurney's sign.     Hernia: No hernia is present.  Genitourinary:    Labia:        Right: No rash, tenderness, lesion or injury.        Left: No rash, tenderness, lesion or injury.      Cervix: Cervical motion tenderness, discharge and cervical bleeding present.     Uterus: Tender  and with uterine prolapse.      Adnexa: Right adnexa normal and left adnexa normal.       Right: No mass, tenderness or fullness.         Left: No mass, tenderness or fullness.       Rectum: Normal. Guaiac result negative. No tenderness or external hemorrhoid. Normal anal tone.     Comments: No stool noted on rectal exam. Musculoskeletal: Normal range of motion.  Skin:    General: Skin is warm.     Findings: No rash.  Neurological:     Mental Status: She is alert and oriented to person, place, and time.    ED Treatments / Results  Labs (all labs ordered are listed, but only abnormal results are displayed) Labs Reviewed  WET PREP, GENITAL - Abnormal; Notable for the following components:      Result Value   WBC, Wet Prep HPF POC MODERATE (*)    All other components within normal limits  URINALYSIS, ROUTINE W REFLEX MICROSCOPIC - Abnormal; Notable for the following components:   Hgb urine dipstick MODERATE (*)    Bilirubin Urine SMALL (*)    Leukocytes, UA TRACE (*)    All other components within normal limits  COMPREHENSIVE METABOLIC PANEL - Abnormal; Notable for the following components:   Glucose, Bld 110 (*)    AST 13 (*)    All other components within normal limits    URINALYSIS, MICROSCOPIC (REFLEX) - Abnormal; Notable for the following components:   Bacteria, UA FEW (*)    All other components within normal limits  URINE CULTURE  PREGNANCY, URINE  CBC WITH DIFFERENTIAL/PLATELET  LIPASE, BLOOD  OCCULT BLOOD X 1 CARD TO LAB, STOOL  GC/CHLAMYDIA PROBE AMP ([place]    EKG None  Radiology [place] Transvaginal Non-ob  Result Date: 05/10/2018 CLINICAL DATA:  Generalized pelvic pain for 1 day. History of endometriosis, PID and ovarian cyst. EXAM: TRANSABDOMINAL AND TRANSVAGINAL ULTRASOUND OF PELVIS DOPPLER ULTRASOUND OF OVARIES TECHNIQUE: Both transabdominal and transvaginal ultrasound examinations of the pelvis were performed. Transabdominal technique was performed for global imaging of the pelvis including uterus, ovaries, adnexal regions, and pelvic cul-de-sac. It was necessary to proceed with endovaginal exam following the transabdominal exam to visualize the ovaries, uterus and endometrium. Color and duplex Doppler ultrasound was utilized to evaluate blood flow to the ovaries. COMPARISON:  None. FINDINGS: Diminished exam detail due to overlying bowel gas. Uterus Measurements: 6.4 by 3.2 x 4.6 cm = volume: 49 mL. Retroverted. No fibroids or other mass visualized. Endometrium Thickness: 5 mm.  No focal abnormality visualized. Right ovary Not visualized Left ovary Not visualized Pulsed Doppler evaluation could not be performed due to overlying bowel gas. Other findings No abnormal free fluid. IMPRESSION: 1. Exam detail diminished due to excessive bowel gas. Nonvisualization of the ovaries. 2. Unremarkable appearance of the uterus and endometrium. Electronically Signed   By: Signa Kellaylor  Stroud M.D.   On: 05/10/2018 12:52   [place] Pelvis Complete  Result Date: 05/10/2018 CLINICAL DATA:  Generalized pelvic pain for 1 day. History of endometriosis, PID and ovarian cyst. EXAM: TRANSABDOMINAL AND TRANSVAGINAL ULTRASOUND OF PELVIS DOPPLER ULTRASOUND OF OVARIES  TECHNIQUE: Both transabdominal and transvaginal ultrasound examinations of the pelvis were performed. Transabdominal technique was performed for global imaging of the pelvis including uterus, ovaries, adnexal regions, and pelvic cul-de-sac. It was necessary to proceed with endovaginal exam following the transabdominal exam to visualize the ovaries, uterus and endometrium. Color and duplex Doppler  ultrasound was utilized to evaluate blood flow to the ovaries. COMPARISON:  None. FINDINGS: Diminished exam detail due to overlying bowel gas. Uterus Measurements: 6.4 by 3.2 x 4.6 cm = volume: 49 mL. Retroverted. No fibroids or other mass visualized. Endometrium Thickness: 5 mm.  No focal abnormality visualized. Right ovary Not visualized Left ovary Not visualized Pulsed Doppler evaluation could not be performed due to overlying bowel gas. Other findings No abnormal free fluid. IMPRESSION: 1. Exam detail diminished due to excessive bowel gas. Nonvisualization of the ovaries. 2. Unremarkable appearance of the uterus and endometrium. Electronically Signed   By: Signa Kell M.D.   On: 05/10/2018 12:52   Ct Abdomen Pelvis W Contrast  Result Date: 05/10/2018 CLINICAL DATA:  Periumbilical abdominal pain. EXAM: CT ABDOMEN AND PELVIS WITH CONTRAST TECHNIQUE: Multidetector CT imaging of the abdomen and pelvis was performed using the standard protocol following bolus administration of intravenous contrast. CONTRAST:  ISOVUE-300 IOPAMIDOL (ISOVUE-300) INJECTION 61% COMPARISON:  10/10/2014, and ultrasound of 05/08/2015 FINDINGS: Lower chest: Centrilobular emphysema. Bibasilar scarring or atelectasis. 2 by 3 mm left lower lobe subpleural nodule on image 8/4, no change from 10/10/2014, considered benign. Hepatobiliary: Unremarkable Pancreas: Unremarkable Spleen: Unremarkable Adrenals/Urinary Tract: Adrenal glands normal. 1.1 by 1.0 cm fluid density lesion of the left kidney upper pole compatible with cyst in stable from  10/10/2014 by my measurements. Stomach/Bowel: Nondistended ascending and transverse colon. Normal appendix. No dilated bowel or appreciable significant bowel wall thickening. Vascular/Lymphatic: Aortoiliac atherosclerotic vascular disease. Reproductive: Borderline prominent parametrial vasculature on the left. Uterus and ovarian contours appear unremarkable. Other: No supplemental non-categorized findings. Musculoskeletal: Unremarkable IMPRESSION: 1. A specific cause for the patient's abdominal pain is not identified. 2. Aortic Atherosclerosis (ICD10-I70.0) and Emphysema (ICD10-J43.9). Electronically Signed   By: Gaylyn Rong M.D.   On: 05/10/2018 13:45   [place] Art/ven Flow Abd Pelv Doppler  Result Date: 05/10/2018 CLINICAL DATA:  Generalized pelvic pain for 1 day. History of endometriosis, PID and ovarian cyst. EXAM: TRANSABDOMINAL AND TRANSVAGINAL ULTRASOUND OF PELVIS DOPPLER ULTRASOUND OF OVARIES TECHNIQUE: Both transabdominal and transvaginal ultrasound examinations of the pelvis were performed. Transabdominal technique was performed for global imaging of the pelvis including uterus, ovaries, adnexal regions, and pelvic cul-de-sac. It was necessary to proceed with endovaginal exam following the transabdominal exam to visualize the ovaries, uterus and endometrium. Color and duplex Doppler ultrasound was utilized to evaluate blood flow to the ovaries. COMPARISON:  None. FINDINGS: Diminished exam detail due to overlying bowel gas. Uterus Measurements: 6.4 by 3.2 x 4.6 cm = volume: 49 mL. Retroverted. No fibroids or other mass visualized. Endometrium Thickness: 5 mm.  No focal abnormality visualized. Right ovary Not visualized Left ovary Not visualized Pulsed Doppler evaluation could not be performed due to overlying bowel gas. Other findings No abnormal free fluid. IMPRESSION: 1. Exam detail diminished due to excessive bowel gas. Nonvisualization of the ovaries. 2. Unremarkable appearance of the uterus and  endometrium. Electronically Signed   By: Signa Kell M.D.   On: 05/10/2018 12:52    Procedures Procedures (including critical care time)  Medications Ordered in ED Medications  ondansetron (ZOFRAN) injection 4 mg (4 mg Intravenous Given 05/10/18 1135)  acetaminophen (TYLENOL) tablet 650 mg (650 mg Oral Given 05/10/18 1135)  iopamidol (ISOVUE-300) 61 % injection 100 mL (100 mLs Intravenous Contrast Given 05/10/18 1306)  ketorolac (TORADOL) 30 MG/ML injection 30 mg (30 mg Intravenous Given 05/10/18 1430)  sodium chloride 0.9 % bolus 1,000 mL ( Intravenous Rate/Dose Verify 05/10/18 1505)  cefTRIAXone (  ROCEPHIN) injection 250 mg (250 mg Intramuscular Given 05/10/18 1431)     Initial Impression / Assessment and Plan / ED Course  I have reviewed the triage vital signs and the nursing notes.  Pertinent labs & imaging results that were available during my care of the patient were reviewed by me and considered in my medical decision making (see chart for details).  Clinical Course as of May 10 1538  Sun May 10, 2018  1139 Trace leukocytes, bacteria, hgb noted on UA.   Leukocytes, UA(!): TRACE [AH]  1139 CBC is unremarkable.  CBC with Differential [AH]  1205 WBCs noted on wet prep.   WBC, Wet Prep HPF POC(!): MODERATE [AH]  1406 Exam detail diminished due to excessive bowel gas. Nonvisualization of the ovaries. Unremarkable appearance of the uterus and endometrium.    US Pelvis Complete [AH]  1408 Centrilobular emphysema. Bibasilar scarring or atelectasis. 2 by 3 mm left lower lobe subpleural nodule that is unchanged from 10/10/2014, considered benign. 1.1 by 1.0 cm fluid density lesion of the left kidney upper pole compatible with cyst stable from 10/10/2014 by my measurements. Aortoiliac atherosclerotic vascular disease.       CT Abdomen Pelvis W Contrast [AH]  1512 BP improved with IVF.  BP: 103/66 [AH]  1532 Patient was resting comfortably in exam room. Patient reports pain has  improved while in the ER.    [AH]    Clinical Course User Index [AH] Leretha Dykes, PA-C   Patient is nontoxic, nonseptic appearing, in no apparent distress.  Patient's pain and other symptoms adequately managed in emergency department.  Fluid bolus given.  Labs, imaging and vitals reviewed.  Patient does not meet the SIRS or Sepsis criteria.  On repeat exam patient does not have a surgical abdomin and there are no peritoneal signs.  CT abdomen is negative. No indication of appendicitis, bowel obstruction, bowel perforation, cholecystitis, diverticulitis, or ectopic pregnancy.  Patient discharged home with symptomatic treatment and given strict instructions for follow-up with their primary care physician.  Concern for PID due to history and physical exam. Treated with IM ceftriaxone and will prescribe doxycycline. Discussed all findings with patient. I have also discussed reasons to return immediately to the ER.  Advised patient to follow up with OBGYN in 2 days. Patient expresses understanding and agrees with plan.  Findings and plan of care discussed with supervising physician Dr. Juleen China.  Final Clinical Impressions(s) / ED Diagnoses   Final diagnoses:  Suprapubic abdominal pain  Pelvic pain in female    ED Discharge Orders         Ordered    naproxen (NAPROSYN) 500 MG tablet  2 times daily     05/10/18 1537    doxycycline (VIBRAMYCIN) 100 MG capsule  2 times daily     05/10/18 [address] [place], [place] 05/10/18 1540    Raeford Razor, MD 05/11/18 [phone]

## 2018-05-11 LAB — URINE CULTURE

## 2018-05-11 LAB — GC/CHLAMYDIA PROBE AMP (~~LOC~~) NOT AT ARMC
Chlamydia: NEGATIVE
NEISSERIA GONORRHEA: NEGATIVE

## 2018-09-09 ENCOUNTER — Other Ambulatory Visit: Payer: Self-pay

## 2018-09-09 ENCOUNTER — Emergency Department (HOSPITAL_BASED_OUTPATIENT_CLINIC_OR_DEPARTMENT_OTHER): Payer: Self-pay

## 2018-09-09 ENCOUNTER — Encounter (HOSPITAL_BASED_OUTPATIENT_CLINIC_OR_DEPARTMENT_OTHER): Payer: Self-pay | Admitting: Emergency Medicine

## 2018-09-09 ENCOUNTER — Emergency Department (HOSPITAL_BASED_OUTPATIENT_CLINIC_OR_DEPARTMENT_OTHER)
Admission: EM | Admit: 2018-09-09 | Discharge: 2018-09-09 | Disposition: A | Discharge status: Home / Self Care | Payer: Self-pay | Attending: Emergency Medicine | Admitting: Emergency Medicine

## 2018-09-09 DIAGNOSIS — Z79899 Other long term (current) drug therapy: Secondary | ICD-10-CM | POA: Insufficient documentation

## 2018-09-09 DIAGNOSIS — F17210 Nicotine dependence, cigarettes, uncomplicated: Secondary | ICD-10-CM | POA: Insufficient documentation

## 2018-09-09 DIAGNOSIS — B9689 Other specified bacterial agents as the cause of diseases classified elsewhere: Secondary | ICD-10-CM

## 2018-09-09 DIAGNOSIS — J449 Chronic obstructive pulmonary disease, unspecified: Secondary | ICD-10-CM | POA: Insufficient documentation

## 2018-09-09 DIAGNOSIS — R1030 Lower abdominal pain, unspecified: Secondary | ICD-10-CM | POA: Insufficient documentation

## 2018-09-09 DIAGNOSIS — N760 Acute vaginitis: Secondary | ICD-10-CM | POA: Insufficient documentation

## 2018-09-09 HISTORY — DX: Other specified diseases of anus and rectum: K62.89

## 2018-09-09 LAB — URINALYSIS, MICROSCOPIC (REFLEX)

## 2018-09-09 LAB — URINALYSIS, ROUTINE W REFLEX MICROSCOPIC
Bilirubin Urine: NEGATIVE
Glucose, UA: NEGATIVE mg/dL
Ketones, ur: NEGATIVE mg/dL
Nitrite: NEGATIVE
Protein, ur: NEGATIVE mg/dL
Specific Gravity, Urine: 1.015 (ref 1.005–1.030)
pH: 6.5 (ref 5.0–8.0)

## 2018-09-09 LAB — CBC WITH DIFFERENTIAL/PLATELET
Abs Immature Granulocytes: 0.04 10*3/uL (ref 0.00–0.07)
Basophils Absolute: 0.1 10*3/uL (ref 0.0–0.1)
Basophils Relative: 1 %
Eosinophils Absolute: 0.2 10*3/uL (ref 0.0–0.5)
Eosinophils Relative: 2 %
HCT: 43.5 % (ref 36.0–46.0)
Hemoglobin: 13.9 g/dL (ref 12.0–15.0)
Immature Granulocytes: 0 %
Lymphocytes Relative: 25 %
Lymphs Abs: 2.4 10*3/uL (ref 0.7–4.0)
MCH: 31.2 pg (ref 26.0–34.0)
MCHC: 32 g/dL (ref 30.0–36.0)
MCV: 97.8 fL (ref 80.0–100.0)
Monocytes Absolute: 0.7 10*3/uL (ref 0.1–1.0)
Monocytes Relative: 7 %
Neutro Abs: 6.2 10*3/uL (ref 1.7–7.7)
Neutrophils Relative %: 65 %
Platelets: 280 10*3/uL (ref 150–400)
RBC: 4.45 MIL/uL (ref 3.87–5.11)
RDW: 14.7 % (ref 11.5–15.5)
WBC: 9.6 10*3/uL (ref 4.0–10.5)
nRBC: 0 % (ref 0.0–0.2)

## 2018-09-09 LAB — COMPREHENSIVE METABOLIC PANEL
ALT: 10 U/L (ref 0–44)
AST: 12 U/L — ABNORMAL LOW (ref 15–41)
Albumin: 3.7 g/dL (ref 3.5–5.0)
Alkaline Phosphatase: 101 U/L (ref 38–126)
Anion gap: 9 (ref 5–15)
BUN: 5 mg/dL — ABNORMAL LOW (ref 6–20)
CO2: 23 mmol/L (ref 22–32)
Calcium: 8.8 mg/dL — ABNORMAL LOW (ref 8.9–10.3)
Chloride: 109 mmol/L (ref 98–111)
Creatinine, Ser: 0.64 mg/dL (ref 0.44–1.00)
GFR calc Af Amer: >60 mL/min (ref >60)
GFR calc non Af Amer: >60 mL/min (ref >60)
Glucose, Bld: 95 mg/dL (ref 70–99)
Potassium: 3.4 mmol/L — ABNORMAL LOW (ref 3.5–5.1)
Sodium: 141 mmol/L (ref 135–145)
Total Bilirubin: 0.2 mg/dL — ABNORMAL LOW (ref 0.3–1.2)
Total Protein: 6.7 g/dL (ref 6.5–8.1)

## 2018-09-09 LAB — WET PREP, GENITAL
Sperm: NONE SEEN
Trich, Wet Prep: NONE SEEN
Yeast Wet Prep HPF POC: NONE SEEN

## 2018-09-09 LAB — PREGNANCY, URINE: Preg Test, Ur: NEGATIVE

## 2018-09-09 LAB — LIPASE, BLOOD: Lipase: 23 U/L (ref 11–51)

## 2018-09-09 MED ORDER — SODIUM CHLORIDE 0.9 % IV BOLUS
1000.0000 mL | Freq: Once | INTRAVENOUS | Status: AC
  Administered 2018-09-09: 1000 mL via INTRAVENOUS

## 2018-09-09 MED ORDER — FENTANYL CITRATE (PF) 100 MCG/2ML IJ SOLN
50.0000 ug | Freq: Once | INTRAMUSCULAR | Status: AC
  Administered 2018-09-09: 50 ug via INTRAVENOUS
  Filled 2018-09-09: qty 2

## 2018-09-09 MED ORDER — ONDANSETRON HCL 4 MG/2ML IJ SOLN
4.0000 mg | Freq: Once | INTRAMUSCULAR | Status: AC
  Administered 2018-09-09: 4 mg via INTRAVENOUS
  Filled 2018-09-09: qty 2

## 2018-09-09 MED ORDER — KETOROLAC TROMETHAMINE 30 MG/ML IJ SOLN
30.0000 mg | Freq: Once | INTRAMUSCULAR | Status: AC
  Administered 2018-09-09: 30 mg via INTRAVENOUS
  Filled 2018-09-09: qty 1

## 2018-09-09 MED ORDER — IOHEXOL 300 MG/ML  SOLN
100.0000 mL | Freq: Once | INTRAMUSCULAR | Status: AC | PRN
  Administered 2018-09-09: 100 mL via INTRAVENOUS

## 2018-09-09 MED ORDER — METRONIDAZOLE 500 MG PO TABS: 500.0000 mg | ORAL_TABLET | Freq: Two times a day (BID) | ORAL | 0 refills | Status: DC

## 2018-09-09 MED ORDER — MORPHINE SULFATE (PF) 4 MG/ML IV SOLN
4.0000 mg | Freq: Once | INTRAVENOUS | Status: AC
  Administered 2018-09-09: 4 mg via INTRAVENOUS
  Filled 2018-09-09: qty 1

## 2018-09-09 NOTE — ED Notes (Signed)
ED Provider at bedside. Had to stop Triage at this time due to PA coming into the room for her assessment.

## 2018-09-09 NOTE — ED Notes (Signed)
Patient transported to Ultrasound 

## 2018-09-09 NOTE — ED Provider Notes (Signed)
[facility] EMERGENCY DEPARTMENT Provider Note   CSN: [id number] Arrival date & time: 09/09/18  1123    History   Chief Complaint Chief Complaint  Patient presents with   Abdominal Pain    HPI Amanda Floyd is a 48 y.o. female past medical history of asthma, COPD, endometriosis, IBS, chronic rectal pain who presents for evaluation of bilateral lower abdominal pain, right greater than left that began yesterday at about 3 PM.  Patient has a history of chronic abdominal pain related to her endometriosis and IBS.  She states that normally, the pain is generalized and she will feel some discomfort and rectal pain with having bowel movements.  She states that since yesterday at 3 PM, she has had "lightning rods of pain" in her suprapubic and lower abdomen.  She states that the right side is greater than the left.  Patient states that she has had some nausea but no vomiting.  She states she was having diarrhea up until yesterday but states that resolved.  No blood in stool.  She has not noted any fevers.  She reports taking ibuprofen at home with no improvement in symptoms.  Patient denies any fevers, chest pain, difficulty breathing, dysuria, hematuria, vaginal bleeding, vaginal discharge.  She is currently sexually active with her husband.  No other partners.  She states she does not have any concerns for STDs.  Patient states she follows with OB/GYN but she states she has not seen them in several months.  She reports she had an outpatient appointment them scheduled but states it has been moved due to COVID concerns.  Patient denies any recent travel, known sick contacts, known COVID-19 exposure.     The history is provided by the patient.    Past Medical History:  Diagnosis Date   Asthma    COPD (chronic obstructive pulmonary disease) (HCC)    Endometriosis    IBS (irritable bowel syndrome)    Pneumothorax    Rectal pain     There are no active problems to display for  this patient.   Past Surgical History:  Procedure Laterality Date   CHEST TUBE INSERTION  2011   for a couple of days during hospitalization   ENDOMETRIAL BIOPSY     LAPAROSCOPIC ENDOMETRIOSIS FULGURATION       OB History    Gravida  5   Para  5   Term  0   Preterm  0   AB  0   Living  5     SAB  0   TAB  0   Ectopic  0   Multiple  0   Live Births  5        Obstetric Comments  All vaginal, full term deliveries         Home Medications    Prior to Admission medications   Medication Sig Start Date End Date Taking? Authorizing Provider  promethazine (PHENERGAN) 25 MG tablet Take by mouth. 05/13/18  Yes [provider]  ALBUTEROL SULFATE HFA IN Inhale into the lungs.    [provider]  doxycycline (VIBRAMYCIN) 100 MG capsule TK ONE C PO BID FOR 10 DAYS 05/24/18   [provider]  gabapentin (NEURONTIN) 300 MG capsule Take 300 mg by mouth 3 (three) times daily.  09/03/18   [provider]  ibuprofen (ADVIL,MOTRIN) 800 MG tablet Take by mouth. 09/11/17   [provider]  metroNIDAZOLE (FLAGYL) 500 MG tablet Take 1 tablet (500 mg  total) by mouth 2 (two) times daily. 09/09/18   Maxwell CaulLayden, Niyam Bisping A, PA-C  predniSONE (DELTASONE) 20 MG tablet TK 3 TS PO QD FOR 5 DAYS 05/24/18   [provider]    Family History Family History  Problem Relation Age of Onset   Cancer Mother        breast CA   Diabetes Mother    Cancer Father        breast CA, lymphoma   Diabetes Father    Cancer Sister     Social History Social History   Tobacco Use   Smoking status: Current Every Day Smoker    Packs/day: 1.00    Types: Cigarettes   Smokeless tobacco: Never Used  Substance Use Topics   Alcohol use: Not Currently    Alcohol/week: 4.0 standard drinks    Types: 4 Cans of beer per week   Drug use: No     Allergies   Oxycodone and Tramadol   Review of Systems Review of Systems  Constitutional: Negative  for fever.  Respiratory: Negative for cough and shortness of breath.   Cardiovascular: Negative for chest pain.  Gastrointestinal: Positive for abdominal pain, diarrhea and nausea. Negative for vomiting.  Genitourinary: Negative for dysuria, hematuria, vaginal bleeding and vaginal discharge.  Neurological: Negative for headaches.  All other systems reviewed and are negative.    Physical Exam Updated Vital Signs BP 120/75 (BP Location: Left Arm)    Pulse 80    Temp 97.9 F (36.6 C) (Oral)    Resp 16    Ht 5\' 4"  (1.626 m)    Wt 59 kg    SpO2 100%    BMI 22.31 kg/m   Physical Exam Vitals signs and nursing note reviewed. Exam conducted with a chaperone present.  Constitutional:      Appearance: Normal appearance. She is well-developed.  HENT:     Head: Normocephalic and atraumatic.  Eyes:     General: Lids are normal.     Conjunctiva/sclera: Conjunctivae normal.     Pupils: Pupils are equal, round, and reactive to light.  Neck:     Musculoskeletal: Full passive range of motion without pain.  Cardiovascular:     Rate and Rhythm: Normal rate and regular rhythm.     Pulses: Normal pulses.     Heart sounds: Normal heart sounds. No murmur. No friction rub. No gallop.   Pulmonary:     Effort: Pulmonary effort is normal.     Breath sounds: Normal breath sounds.     Comments: Lungs clear to auscultation bilaterally.  Symmetric chest rise.  No wheezing, rales, rhonchi. Abdominal:     Palpations: Abdomen is soft. Abdomen is not rigid.     Tenderness: There is abdominal tenderness in the right lower quadrant and suprapubic area. There is no guarding.     Comments: Abdomen is soft, nondistended.  Diffuse tenderness noted to the suprapubic and right lower quadrant.  No CVA tenderness.  No rigidity, guarding.  Genitourinary:    Vagina: Normal.     Cervix: Discharge and friability present. No cervical motion tenderness.     Uterus: Normal.      Adnexa: Left adnexa normal.       Right:  Tenderness present. No mass.         Left: No mass or tenderness.       Comments: The exam was performed with a chaperone present. Normal external female genitalia. No lesions, rash, or sores.  Thin white discharge  noted in the vaginal vault.  Cervix with some erythema/friability.  No CMT.  No left adnexal mass or tenderness.  Mild right adnexal tenderness.  No mass palpated.  No evidence of rectal prolapse. Musculoskeletal: Normal range of motion.  Skin:    General: Skin is warm and dry.     Capillary Refill: Capillary refill takes less than 2 seconds.  Neurological:     Mental Status: She is alert and oriented to person, place, and time.  Psychiatric:        Speech: Speech normal.      ED Treatments / Results  Labs (all labs ordered are listed, but only abnormal results are displayed) Labs Reviewed  WET PREP, GENITAL - Abnormal; Notable for the following components:      Result Value   Clue Cells Wet Prep HPF POC PRESENT (*)    WBC, Wet Prep HPF POC MANY (*)    All other components within normal limits  COMPREHENSIVE METABOLIC PANEL - Abnormal; Notable for the following components:   Potassium 3.4 (*)    BUN 5 (*)    Calcium 8.8 (*)    AST 12 (*)    Total Bilirubin 0.2 (*)    All other components within normal limits  URINALYSIS, ROUTINE W REFLEX MICROSCOPIC - Abnormal; Notable for the following components:   Hgb urine dipstick MODERATE (*)    Leukocytes,Ua SMALL (*)    All other components within normal limits  URINALYSIS, MICROSCOPIC (REFLEX) - Abnormal; Notable for the following components:   Bacteria, UA FEW (*)    All other components within normal limits  LIPASE, BLOOD  CBC WITH DIFFERENTIAL/PLATELET  PREGNANCY, URINE  GC/CHLAMYDIA PROBE AMP ([place]) NOT AT [facility]    EKG None  Radiology US Transvaginal Non-ob  Result Date: 09/09/2018 CLINICAL DATA:  Acute pelvic pain. EXAM: TRANSABDOMINAL AND TRANSVAGINAL ULTRASOUND OF PELVIS DOPPLER ULTRASOUND OF OVARIES  TECHNIQUE: Both transabdominal and transvaginal ultrasound examinations of the pelvis were performed. Transabdominal technique was performed for global imaging of the pelvis including uterus, ovaries, adnexal regions, and pelvic cul-de-sac. It was necessary to proceed with endovaginal exam following the transabdominal exam to visualize the endometrium and ovaries. Color and duplex Doppler ultrasound was utilized to evaluate blood flow to the ovaries. COMPARISON:  None. FINDINGS: Uterus Measurements: 6.0 x 4.2 x 3.2 cm = volume: 41 mL. No fibroids or other mass visualized. Endometrium Thickness: 6 mm which is within normal limits. No focal abnormality visualized. Right ovary Measurements: 1.2 x 2.4 x 2.1 cm = volume: 3 mL. Normal appearance/no adnexal mass. Left ovary Measurements: 2.1 x 2.0 x 1.2 cm = volume: 3 mL. Normal appearance/no adnexal mass. Pulsed Doppler evaluation of both ovaries demonstrates normal low-resistance arterial and venous waveforms. Other findings No abnormal free fluid. IMPRESSION: No abnormality seen in the pelvis. Electronically Signed   By: Lupita Raider M.D.   On: 09/09/2018 14:53   US Pelvis Complete  Result Date: 09/09/2018 CLINICAL DATA:  Acute pelvic pain. EXAM: TRANSABDOMINAL AND TRANSVAGINAL ULTRASOUND OF PELVIS DOPPLER ULTRASOUND OF OVARIES TECHNIQUE: Both transabdominal and transvaginal ultrasound examinations of the pelvis were performed. Transabdominal technique was performed for global imaging of the pelvis including uterus, ovaries, adnexal regions, and pelvic cul-de-sac. It was necessary to proceed with endovaginal exam following the transabdominal exam to visualize the endometrium and ovaries. Color and duplex Doppler ultrasound was utilized to evaluate blood flow to the ovaries. COMPARISON:  None. FINDINGS: Uterus Measurements: 6.0 x 4.2 x 3.2 cm =  volume: 41 mL. No fibroids or other mass visualized. Endometrium Thickness: 6 mm which is within normal limits. No focal  abnormality visualized. Right ovary Measurements: 1.2 x 2.4 x 2.1 cm = volume: 3 mL. Normal appearance/no adnexal mass. Left ovary Measurements: 2.1 x 2.0 x 1.2 cm = volume: 3 mL. Normal appearance/no adnexal mass. Pulsed Doppler evaluation of both ovaries demonstrates normal low-resistance arterial and venous waveforms. Other findings No abnormal free fluid. IMPRESSION: No abnormality seen in the pelvis. Electronically Signed   By: Lupita Raider M.D.   On: 09/09/2018 14:53   Ct Abdomen Pelvis W Contrast  Result Date: 09/09/2018 CLINICAL DATA:  Diarrhea for 2 days.  Abdominal pain. EXAM: CT ABDOMEN AND PELVIS WITH CONTRAST TECHNIQUE: Multidetector CT imaging of the abdomen and pelvis was performed using the standard protocol following bolus administration of intravenous contrast. CONTRAST:  OMNIPAQUE IOHEXOL 300 MG/ML  SOLN COMPARISON:  CT 05/10/2018 FINDINGS: Lower chest: Lung bases are clear. Hepatobiliary: No focal hepatic lesion. No biliary duct dilatation. Gallbladder is normal. Common bile duct is normal. Pancreas: Pancreas is normal. No ductal dilatation. No pancreatic inflammation. Spleen: Normal spleen Adrenals/urinary tract: Adrenal glands are normal. Nonenhancing cyst in the cortex of the LEFT kidney. No hydronephrosis. Ureters and bladder normal Stomach/Bowel: Stomach, small bowel, appendix, and cecum are normal. The colon and rectosigmoid colon are normal. Vascular/Lymphatic: Abdominal aorta is normal caliber with atherosclerotic calcification. There is no retroperitoneal or periportal lymphadenopathy. No pelvic lymphadenopathy. Reproductive: Uterus and ovaries normal. Other: No free fluid. Musculoskeletal: No aggressive osseous lesion. IMPRESSION: 1. No acute abdominopelvic findings. 2. No inflammation or infection identified in the GI tract. 3. Normal uterus and ovaries. Electronically Signed   By: Genevive Bi M.D.   On: 09/09/2018 12:57   [place] Art/ven Flow Abd Pelv Doppler  Result  Date: 09/09/2018 CLINICAL DATA:  Acute pelvic pain. EXAM: TRANSABDOMINAL AND TRANSVAGINAL ULTRASOUND OF PELVIS DOPPLER ULTRASOUND OF OVARIES TECHNIQUE: Both transabdominal and transvaginal ultrasound examinations of the pelvis were performed. Transabdominal technique was performed for global imaging of the pelvis including uterus, ovaries, adnexal regions, and pelvic cul-de-sac. It was necessary to proceed with endovaginal exam following the transabdominal exam to visualize the endometrium and ovaries. Color and duplex Doppler ultrasound was utilized to evaluate blood flow to the ovaries. COMPARISON:  None. FINDINGS: Uterus Measurements: 6.0 x 4.2 x 3.2 cm = volume: 41 mL. No fibroids or other mass visualized. Endometrium Thickness: 6 mm which is within normal limits. No focal abnormality visualized. Right ovary Measurements: 1.2 x 2.4 x 2.1 cm = volume: 3 mL. Normal appearance/no adnexal mass. Left ovary Measurements: 2.1 x 2.0 x 1.2 cm = volume: 3 mL. Normal appearance/no adnexal mass. Pulsed Doppler evaluation of both ovaries demonstrates normal low-resistance arterial and venous waveforms. Other findings No abnormal free fluid. IMPRESSION: No abnormality seen in the pelvis. Electronically Signed   By: Lupita Raider M.D.   On: 09/09/2018 14:53    Procedures Procedures (including critical care time)  Medications Ordered in ED Medications  sodium chloride 0.9 % bolus 1,000 mL ( Intravenous Stopped 09/09/18 1328)  ondansetron (ZOFRAN) injection 4 mg (4 mg Intravenous Given 09/09/18 1201)  morphine 4 MG/ML injection 4 mg (4 mg Intravenous Given 09/09/18 1202)  iohexol (OMNIPAQUE) 300 MG/ML solution 100 mL (100 mLs Intravenous Contrast Given 09/09/18 1228)  fentaNYL (SUBLIMAZE) injection 50 mcg (50 mcg Intravenous Given 09/09/18 1336)  ketorolac (TORADOL) 30 MG/ML injection 30 mg (30 mg Intravenous Given 09/09/18 1523)  Initial Impression / Assessment and Plan / ED Course  I have reviewed the triage  vital signs and the nursing notes.  Pertinent labs & imaging results that were available during my care of the patient were reviewed by me and considered in my medical decision making (see chart for details).        48 year old female past medical history of IBS, endometriosis, chronic rectal pain who presents for evaluation of lower abdominal pain that began yesterday.  Reports that this pain is different than her normal chronic abdominal pain.  No fevers, vomiting.  She has had some nausea and diarrhea yesterday.  No known sick contacts. Patient is afebrile, non-toxic appearing, sitting comfortably on examination table. Vital signs reviewed and stable.  Patient with tenderness noted to suprapubic and right lower quadrant.  No focal tenderness.  No CVA tenderness.  Concern for infectious etiology versus GU etiology.  Will plan for labs.  Lipase within normal limits.  Urine pregnancy negative.  CMP shows potassium of 3.4.  BUN and creatinine are within normal limits.  No abnormalities of LFTs.  CBC shows no evidence of leukocytosis or anemia.  UA does show moderate hemoglobin with small leukocytes.  There is squamous epithelium so question contaminant.  CT and pelvis reviewed.  Appendix within normal limits.  No acute intra-abdominal abnormality.  No evidence of inflammation or infection.  RN inform me that patient was complaining of continued pain.  Repeat evaluation shows she is still significantly tender in the right lower quadrant.  Vitals stable.  I discussed with patient that her work-up was reassuring.  Given that she is continued to have pain, will plan for pelvic.  Pelvic exam as documented above.  Patient tolerated procedure well.  She did have some right adnexal tenderness.  Will plan for ultrasound evaluation.  Exam not concerning for PID.  Ultrasound shows no evidence of adnexal mass on either the left or right ovary.  No evidence of torsion.  Wet prep is positive for clue cells.   Otherwise no other abnormalities.  Given white discharge noted on exam as well as some suprapubic abdominal tenderness, will plan to treat. Patient has had flagyl previously.   RN inform me that patient was requesting some more pain medication.  Review of PMP does show a narcotic score of 390.  Her most recent narcotic use was 08/24/18.  At this time, no indication for narcotics to go home with.  On my repeat evaluation, patient is on her phone and appears in no acute distress.  Vitals are stable.  I discussed with patient that work-up here is reassuring.  Encouraged her to follow-up with her PCP/OBGYN as this could be related to her endometriosis. At this time, patient exhibits no emergent life-threatening condition that require further evaluation in ED or admission. Patient had ample opportunity for questions and discussion. All patient's questions were answered with full understanding. Strict return precautions discussed. Patient expresses understanding and agreement to plan.   Portions of this note were generated with Scientist, clinical (histocompatibility and immunogenetics). Dictation errors may occur despite best attempts at proofreading.   Final Clinical Impressions(s) / ED Diagnoses   Final diagnoses:  Lower abdominal pain  Bacterial vaginosis    ED Discharge Orders         Ordered    metroNIDAZOLE (FLAGYL) 500 MG tablet  2 times daily     09/09/18 1529           Maxwell Caul, PA-C 09/09/18 1624  Gwyneth Sprout, MD 09/12/18 [phone]

## 2018-09-09 NOTE — ED Triage Notes (Signed)
Bilateral low abd pain since yesterday.  Sts she has chronic low abd pain and endometriosis but "this is different."  Has been having some diarrhea but this pain started about the time the diarrhea stopped yesterday.

## 2018-09-09 NOTE — ED Notes (Signed)
ED Provider at bedside discussing all test results and dispo plan of care.

## 2018-09-09 NOTE — ED Notes (Signed)
ED Provider at bedside. 

## 2018-09-09 NOTE — Discharge Instructions (Addendum)
Take Flagyl as directed.  It is very important that you do not consume any alcohol while taking this medication as it will cause you to become violently ill.  You have gonorrhea/committee cultures pending.  If they are positive, you will be notified.  As we discussed, your work-up today was reassuring.  Please follow-up with your OB/GYN.  Return the emergency department for any worsening pain, fevers, vomiting or any other worsening or concerning symptoms.

## 2018-09-10 LAB — GC/CHLAMYDIA PROBE AMP (~~LOC~~) NOT AT ARMC
Chlamydia: NEGATIVE
Neisseria Gonorrhea: NEGATIVE

## 2018-10-05 ENCOUNTER — Emergency Department (HOSPITAL_BASED_OUTPATIENT_CLINIC_OR_DEPARTMENT_OTHER)
Admission: EM | Admit: 2018-10-05 | Discharge: 2018-10-05 | Disposition: A | Discharge status: Home / Self Care | Payer: Self-pay | Attending: Emergency Medicine | Admitting: Emergency Medicine

## 2018-10-05 ENCOUNTER — Other Ambulatory Visit: Payer: Self-pay

## 2018-10-05 ENCOUNTER — Encounter (HOSPITAL_BASED_OUTPATIENT_CLINIC_OR_DEPARTMENT_OTHER): Payer: Self-pay | Admitting: Emergency Medicine

## 2018-10-05 DIAGNOSIS — K0889 Other specified disorders of teeth and supporting structures: Secondary | ICD-10-CM | POA: Insufficient documentation

## 2018-10-05 DIAGNOSIS — Z79899 Other long term (current) drug therapy: Secondary | ICD-10-CM | POA: Insufficient documentation

## 2018-10-05 DIAGNOSIS — F17210 Nicotine dependence, cigarettes, uncomplicated: Secondary | ICD-10-CM | POA: Insufficient documentation

## 2018-10-05 DIAGNOSIS — J449 Chronic obstructive pulmonary disease, unspecified: Secondary | ICD-10-CM | POA: Insufficient documentation

## 2018-10-05 MED ORDER — HYDROCODONE-ACETAMINOPHEN 5-325 MG PO TABS: 1.0000 | ORAL_TABLET | Freq: Four times a day (QID) | ORAL | 0 refills | Status: DC | PRN

## 2018-10-05 MED ORDER — PENICILLIN V POTASSIUM 500 MG PO TABS: 500.0000 mg | ORAL_TABLET | Freq: Four times a day (QID) | ORAL | 0 refills | Status: AC

## 2018-10-05 MED FILL — HYDROCODON-APAP 5-325: 5-325 | 2 days supply | Qty: 8 | Fill #0

## 2018-10-05 MED FILL — PENICILLIN VK 500 MG TABLET: 500 | 7 days supply | Qty: 28 | Fill #0

## 2018-10-05 NOTE — ED Notes (Signed)
Pt discharged to home

## 2018-10-05 NOTE — ED Triage Notes (Signed)
Pt reports pain to RT lower gum d/t broken tooth

## 2018-10-11 NOTE — ED Provider Notes (Signed)
[facility] EMERGENCY DEPARTMENT Provider Note   CSN: [id number] Arrival date & time: 10/05/18  [id number]     History   Chief Complaint Chief Complaint  Patient presents with  . Dental Pain    HPI Amanda Floyd is a 48 y.o. female.     HPI   48 year old female with dental pain.  Right lower tooth.  No acute trauma.  Is been worse over the past several days.  Some mild swelling.  No fevers.  No difficulty breathing or swallowing.  Past Medical History:  Diagnosis Date  . Asthma   . COPD (chronic obstructive pulmonary disease) (HCC)   . Endometriosis   . IBS (irritable bowel syndrome)   . Pneumothorax   . Rectal pain     There are no active problems to display for this patient.   Past Surgical History:  Procedure Laterality Date  . CHEST TUBE INSERTION  2011   for a couple of days during hospitalization  . ENDOMETRIAL BIOPSY    . LAPAROSCOPIC ENDOMETRIOSIS FULGURATION       OB History    Gravida  5   Para  5   Term  0   Preterm  0   AB  0   Living  5     SAB  0   TAB  0   Ectopic  0   Multiple  0   Live Births  5        Obstetric Comments  All vaginal, full term deliveries         Home Medications    Prior to Admission medications   Medication Sig Start Date End Date Taking? Authorizing Provider  ALBUTEROL SULFATE HFA IN Inhale into the lungs.    [provider]  doxycycline (VIBRAMYCIN) 100 MG capsule TK ONE C PO BID FOR 10 DAYS 05/24/18   [provider]  gabapentin (NEURONTIN) 300 MG capsule Take 300 mg by mouth 3 (three) times daily.  09/03/18   [provider]  HYDROcodone-acetaminophen (NORCO/VICODIN) 5-325 MG tablet Take 1 tablet by mouth every 6 (six) hours as needed. 10/05/18   Raeford RazorKohut, Monroe Qin, MD  ibuprofen (ADVIL,MOTRIN) 800 MG tablet Take by mouth. 09/11/17   [provider]  metroNIDAZOLE (FLAGYL) 500 MG tablet Take 1 tablet (500 mg total) by mouth 2 (two) times daily. 09/09/18    Graciella FreerLayden, Lindsey A, PA-C  penicillin v potassium (VEETID) 500 MG tablet Take 1 tablet (500 mg total) by mouth 4 (four) times daily for 7 days. 10/05/18 10/12/18  Raeford RazorKohut, Marrah Vanevery, MD  predniSONE (DELTASONE) 20 MG tablet TK 3 TS PO QD FOR 5 DAYS 05/24/18   [provider]  promethazine (PHENERGAN) 25 MG tablet Take by mouth. 05/13/18   [provider]    Family History Family History  Problem Relation Age of Onset  . Cancer Mother        breast CA  . Diabetes Mother   . Cancer Father        breast CA, lymphoma  . Diabetes Father   . Cancer Sister     Social History Social History   Tobacco Use  . Smoking status: Current Every Day Smoker    Packs/day: 1.00    Types: Cigarettes  . Smokeless tobacco: Never Used  Substance Use Topics  . Alcohol use: Not Currently    Alcohol/week: 4.0 standard drinks    Types: 4 Cans of beer per week  . Drug use: No  Allergies   Oxycodone and Tramadol   Review of Systems Review of Systems  All systems reviewed and negative, other than as noted in HPI.  Physical Exam Updated Vital Signs BP 132/78 (BP Location: Right Arm)   Pulse 88   Temp 97.9 F (36.6 C) (Oral)   Resp 20   Ht 5\' 3"  (1.6 m)   Wt 59 kg   SpO2 98%   BMI 23.03 kg/m   Physical Exam Vitals signs and nursing note reviewed.  Constitutional:      General: She is not in acute distress.    Appearance: She is well-developed.  HENT:     Head: Normocephalic.     Comments: Broken right lower tooth.  Perhaps some mild right facial swelling.  Submental tissues are soft.  Normal sounding voice.  Neck is supple. Eyes:     General:        Right eye: No discharge.        Left eye: No discharge.     Conjunctiva/sclera: Conjunctivae normal.  Neck:     Musculoskeletal: Neck supple.  Cardiovascular:     Rate and Rhythm: Normal rate and regular rhythm.     Heart sounds: Normal heart sounds. No murmur. No friction rub. No gallop.   Pulmonary:     Effort:  Pulmonary effort is normal. No respiratory distress.     Breath sounds: Normal breath sounds.  Abdominal:     General: There is no distension.     Palpations: Abdomen is soft.     Tenderness: There is no abdominal tenderness.  Musculoskeletal:        General: No tenderness.  Skin:    General: Skin is warm and dry.  Neurological:     Mental Status: She is alert.  Psychiatric:        Behavior: Behavior normal.        Thought Content: Thought content normal.      ED Treatments / Results  Labs (all labs ordered are listed, but only abnormal results are displayed) Labs Reviewed - No data to display  EKG    Radiology No results found.  Procedures Procedures (including critical care time)  Medications Ordered in ED Medications - No data to display   Initial Impression / Assessment and Plan / ED Course  I have reviewed the triage vital signs and the nursing notes.  Pertinent labs & imaging results that were available during my care of the patient were reviewed by me and considered in my medical decision making (see chart for details).       48 year old female dental pain.  PRN pain medication.  Antibiotics.  Definitive dental follow-up.  Return precautions discussed.  No evidence of serious deep space infection.  Final Clinical Impressions(s) / ED Diagnoses   Final diagnoses:  Pain, dental    ED Discharge Orders         Ordered    penicillin v potassium (VEETID) 500 MG tablet  4 times daily     10/05/18 1016    HYDROcodone-acetaminophen (NORCO/VICODIN) 5-325 MG tablet  Every 6 hours PRN     10/05/18 1016           Virgel Manifold, MD 10/11/18 1143

## 2019-03-18 IMAGING — US US ART/VEN ABD/PELV/SCROTUM DOPPLER LTD
1 series · 13 of 25 positions shown · non-contrast
Comparison: None.

CLINICAL DATA: Generalized pelvic pain for 1 day. History of
endometriosis, PID and ovarian cyst.

EXAM:
TRANSABDOMINAL AND TRANSVAGINAL ULTRASOUND OF PELVIS
DOPPLER ULTRASOUND OF OVARIES
TECHNIQUE: Both transabdominal and transvaginal ultrasound examinations of the
pelvis were performed. Transabdominal technique was performed for
global imaging of the pelvis including uterus, ovaries, adnexal
regions, and pelvic cul-de-sac.
It was necessary to proceed with endovaginal exam following the
transabdominal exam to visualize the ovaries, uterus and
endometrium.. Color and duplex Doppler ultrasound was utilized to
evaluate blood flow to the ovaries.

[Series 1: us art/ven abd/pelv/scrotum doppler ltd · 0.24mm/px · 13 of 35 slices shown]
[im 1/35]
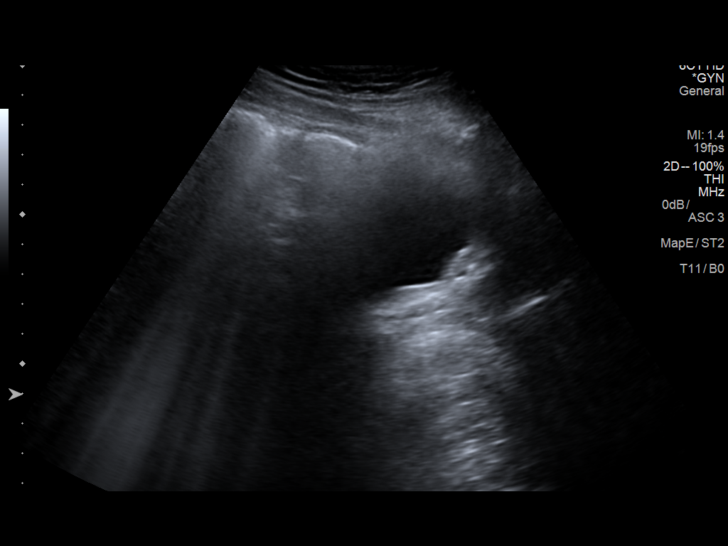
[im 3/35]
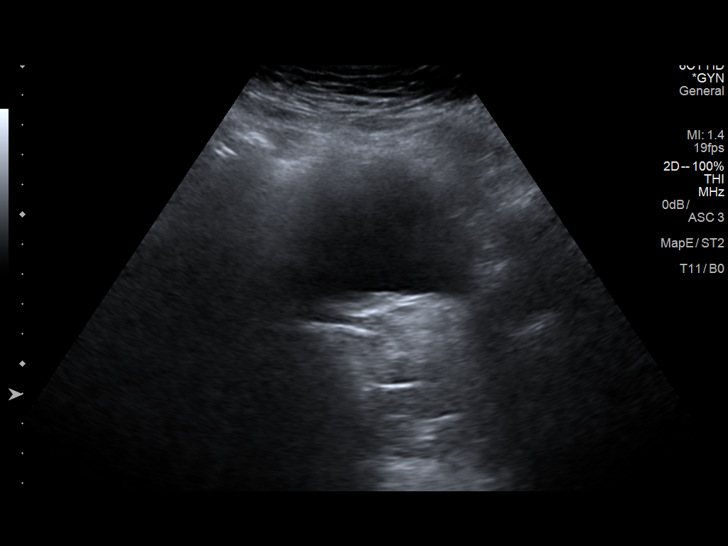
[im 6/35]
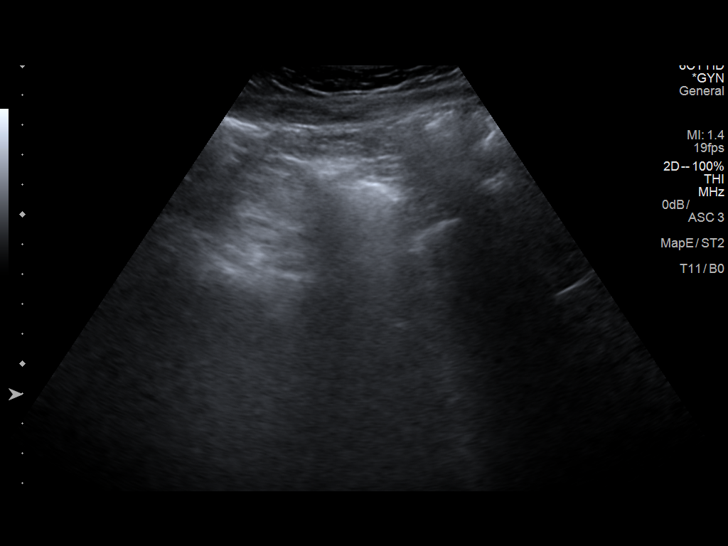
[im 9/35]
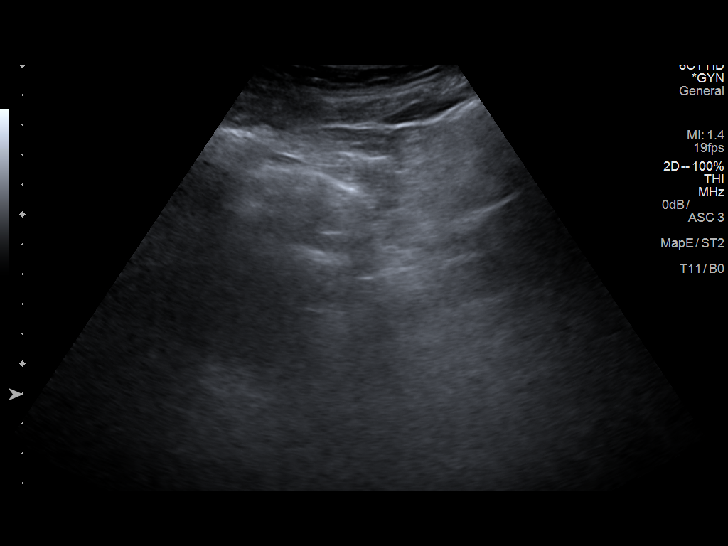
[im 12/35]
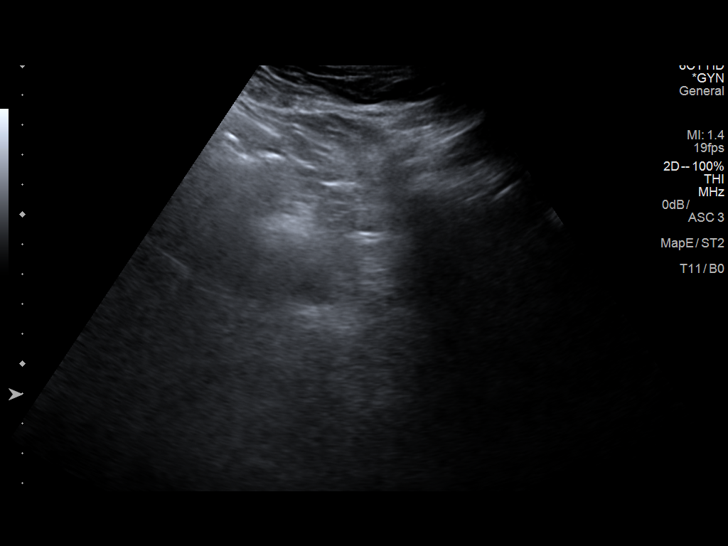
[im 15/35]
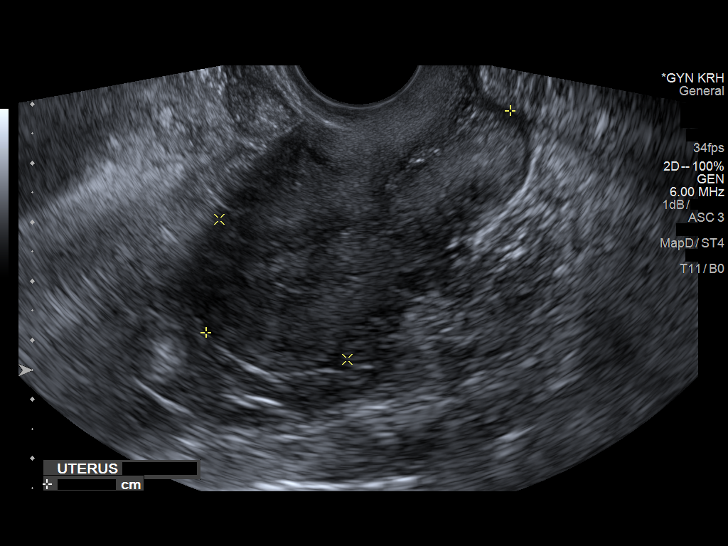
[im 18/35]
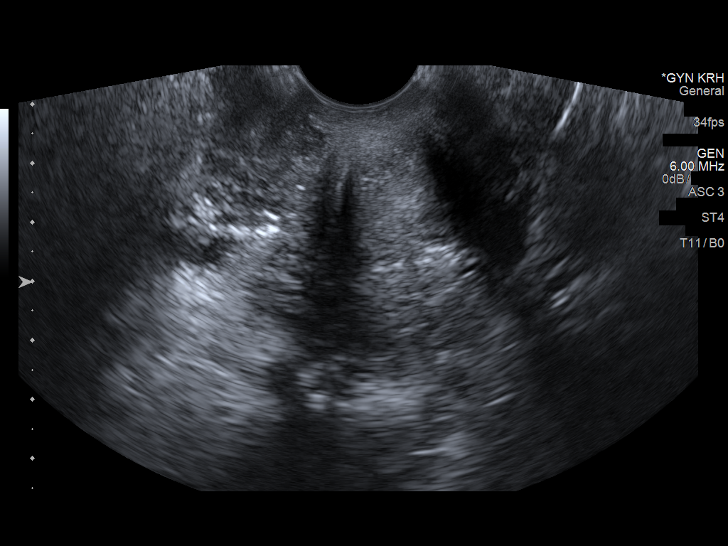
[im 20/35]
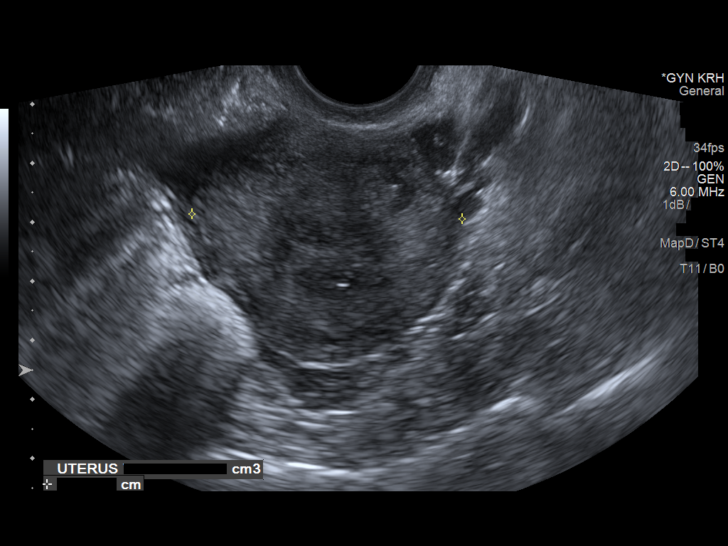
[im 23/35]
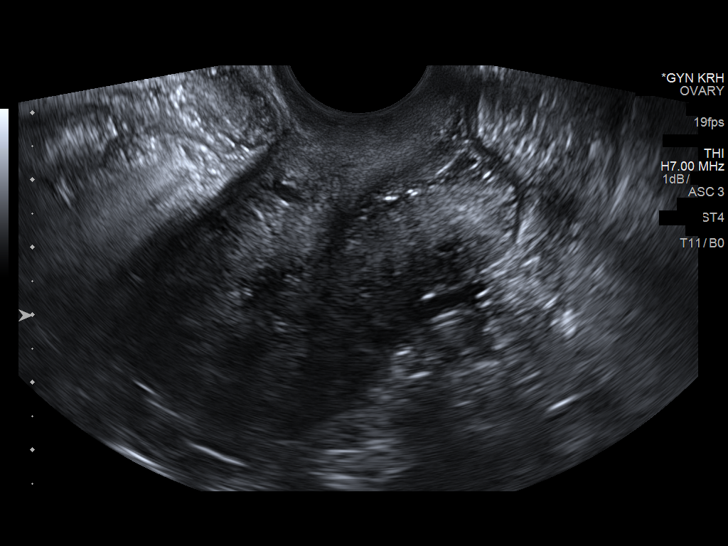
[im 26/35]
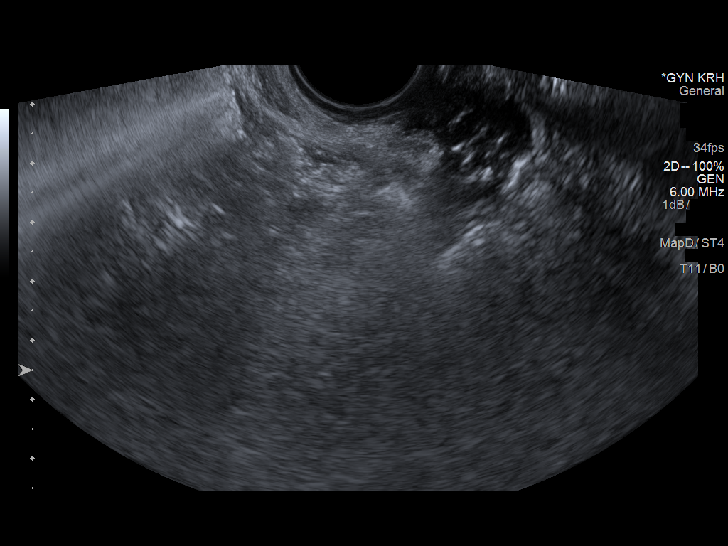
[im 29/35]
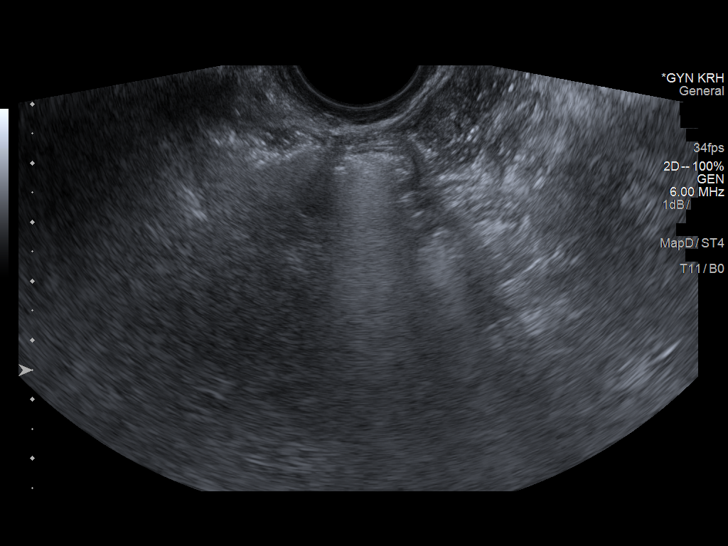
[im 32/35]
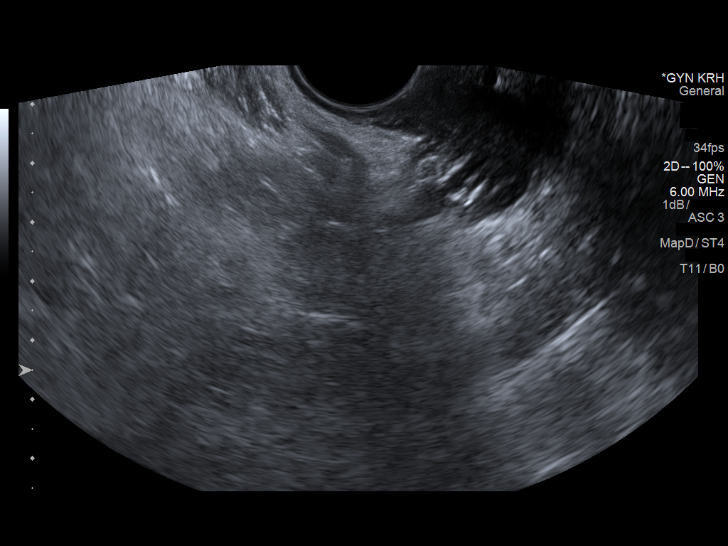
[im 35/35]
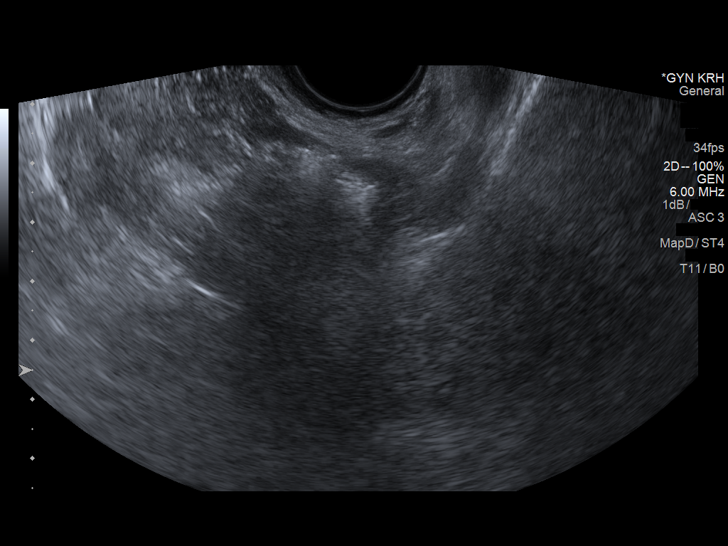

[13 of 25 positions shown; findings below may reference images not displayed]

FINDINGS: Diminished exam detail due to overlying bowel gas.

Uterus

Measurements: 6.4 by 3.2 x 4.6 cm = volume: 49 mL. Retroverted. No
fibroids or other mass visualized.

Endometrium

Thickness: 5 mm.  No focal abnormality visualized.

Right ovary

Not visualized

Left ovary

Not visualized

Pulsed Doppler evaluation could not be performed due to overlying
bowel gas.

Other findings

No abnormal free fluid.
IMPRESSION: 1. Exam detail diminished due to excessive bowel gas.
Nonvisualization of the ovaries.
2. Unremarkable appearance of the uterus and endometrium.

## 2020-02-16 IMAGING — CT CT ABD-PELV W/ CM
2 of 5 series · 16 of 46 positions shown, 18 images · IV contrast (APPLIED)
Comparison: 10/10/2014, and ultrasound of 05/08/2015

CLINICAL DATA: Periumbilical abdominal pain.

EXAM:
CT ABDOMEN AND PELVIS WITH CONTRAST
TECHNIQUE: Multidetector CT imaging of the abdomen and pelvis was performed
using the standard protocol following bolus administration of
intravenous contrast.
CONTRAST:  100mL 34A11Z-GOO IOPAMIDOL (34A11Z-GOO) INJECTION 61%

[Series 2: axial st · axial · 0.70mm/px · z∈[-490,-80]mm · 13 of 92 slices shown, 15 images]
[im 5/92  soft-tissue]
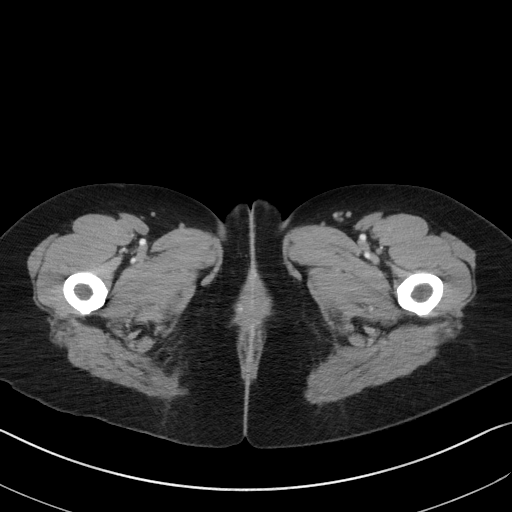
[im 5/92  bone]
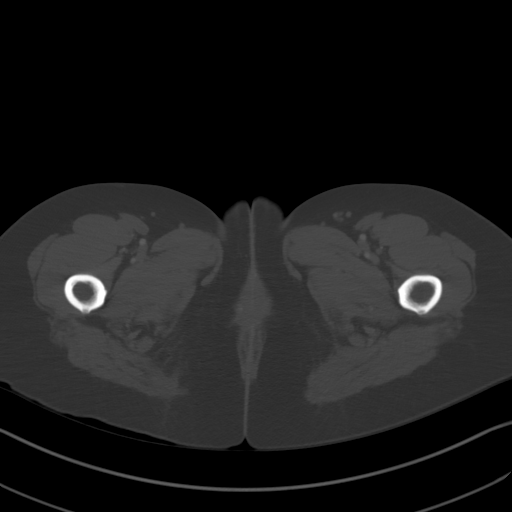
[im 15/92  soft-tissue]
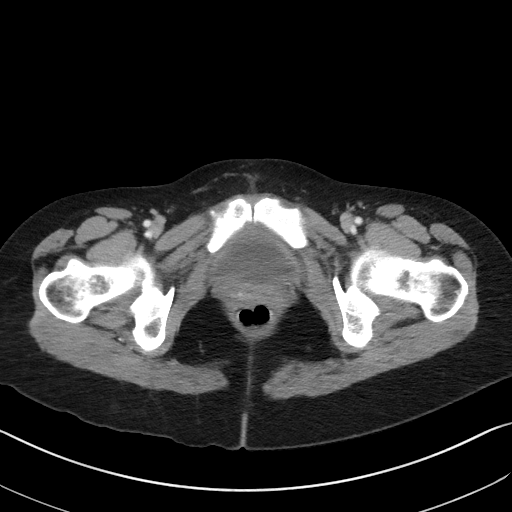
[im 20/92  soft-tissue]
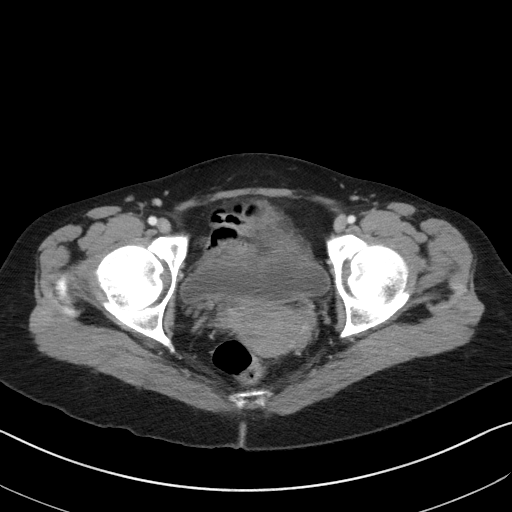
[im 24/92  soft-tissue]
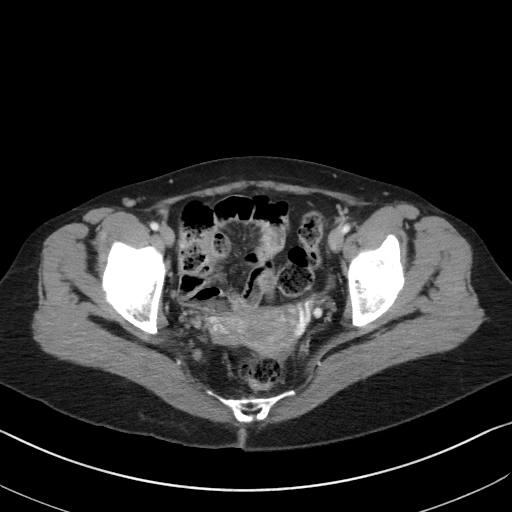
[im 34/92  soft-tissue]
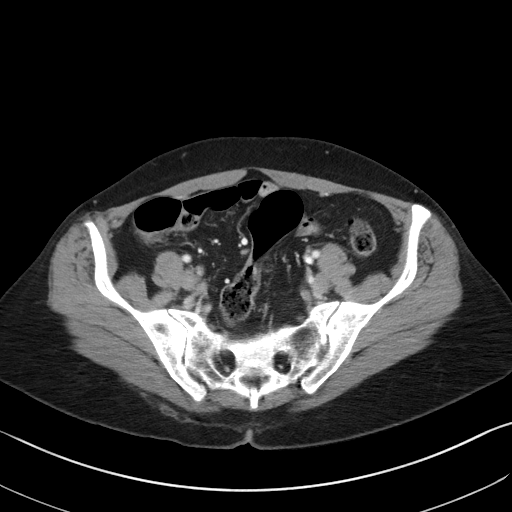
[im 39/92  soft-tissue]
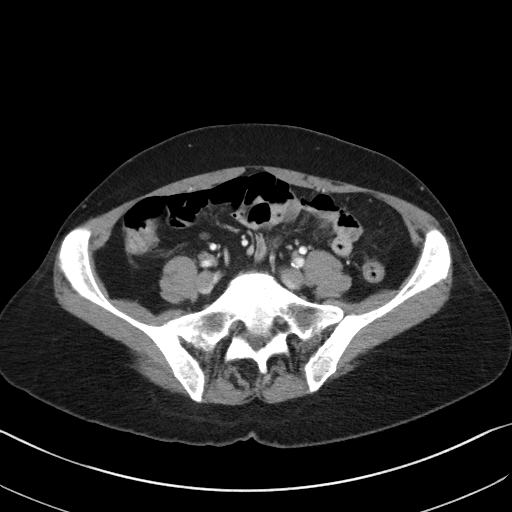
[im 48/92  soft-tissue]
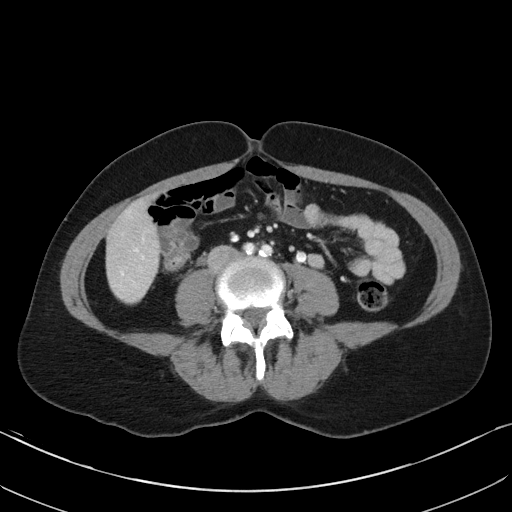
[im 53/92  soft-tissue]
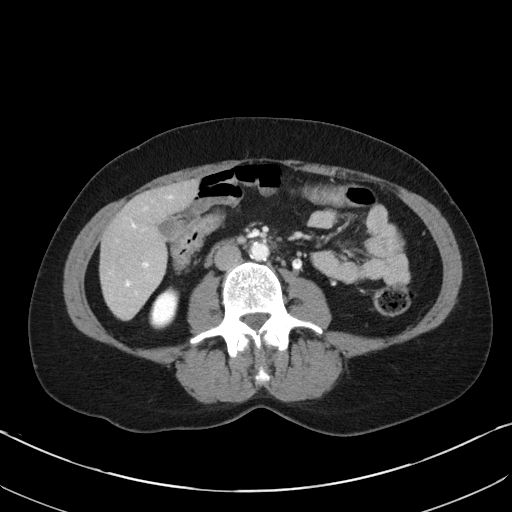
[im 58/92  soft-tissue]
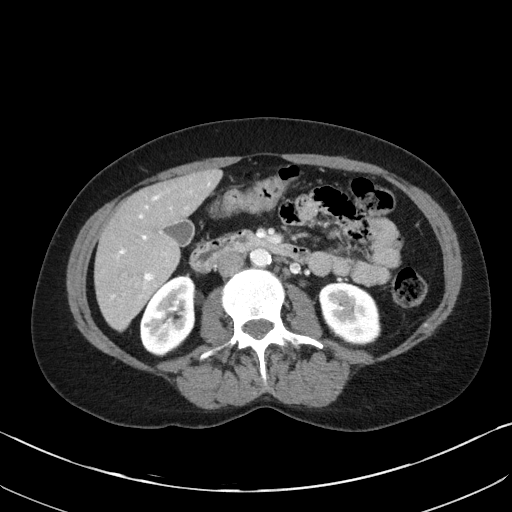
[im 58/92  bone]
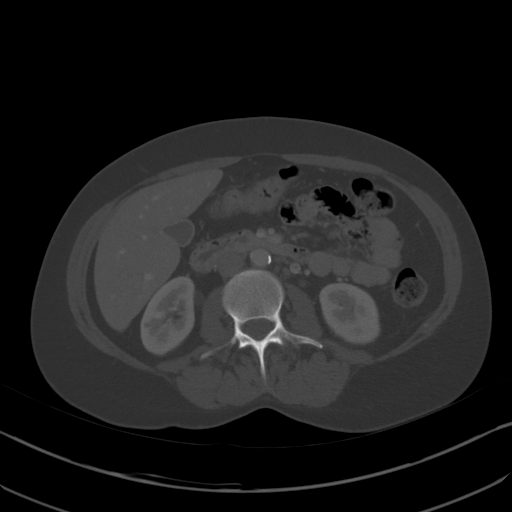
[im 68/92  soft-tissue]
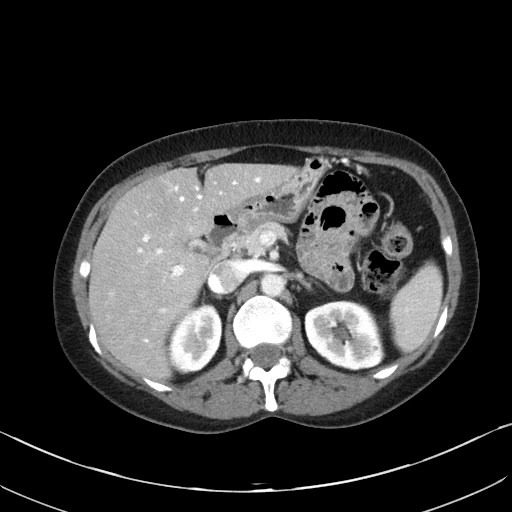
[im 72/92  soft-tissue]
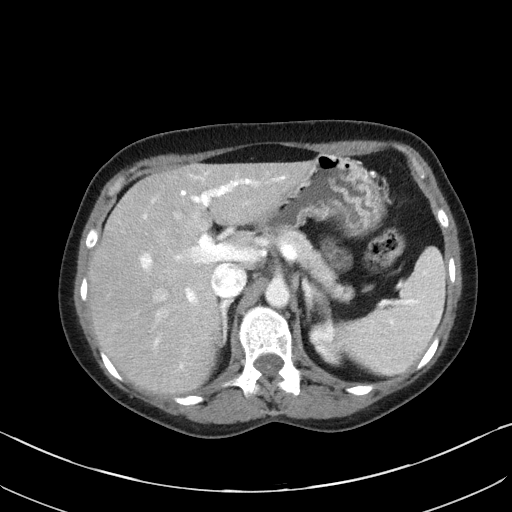
[im 77/92  soft-tissue]
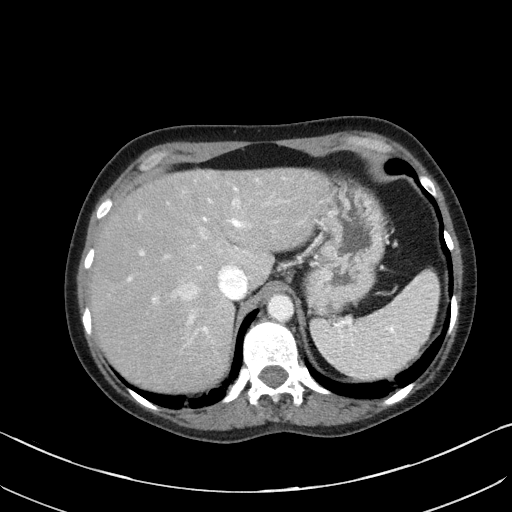
[im 87/92  soft-tissue]
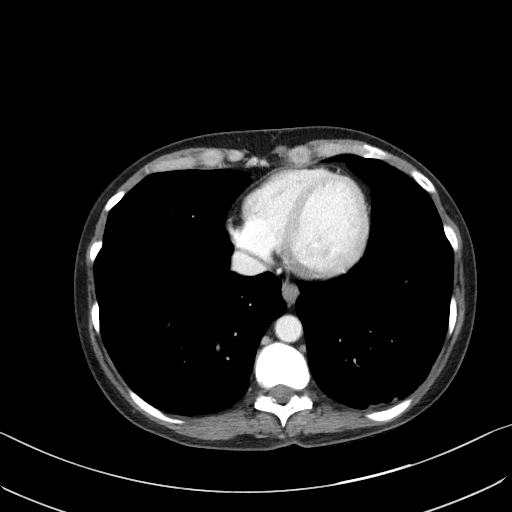

[Series 5: coronal st · coronal · 0.75mm/px · 3 of 74 slices shown]
[im 25/74  soft-tissue]
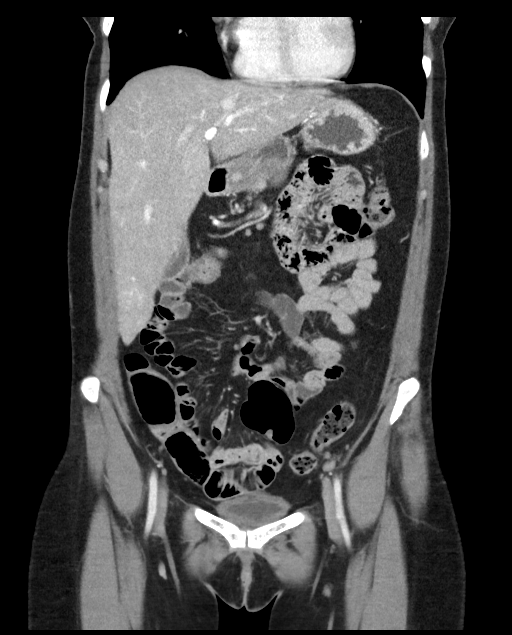
[im 33/74  soft-tissue]
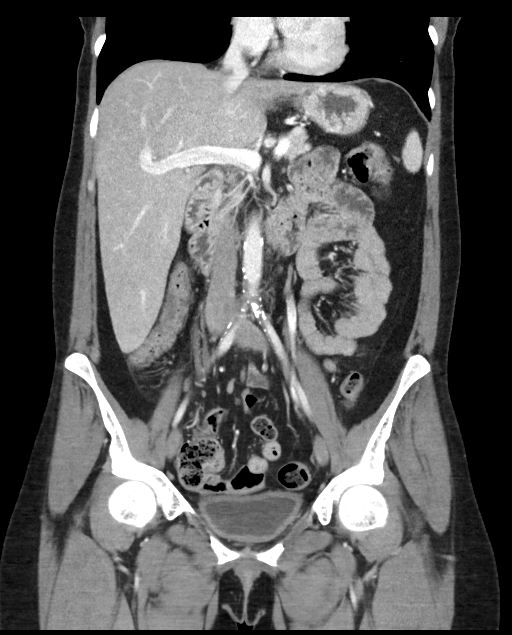
[im 41/74  soft-tissue]
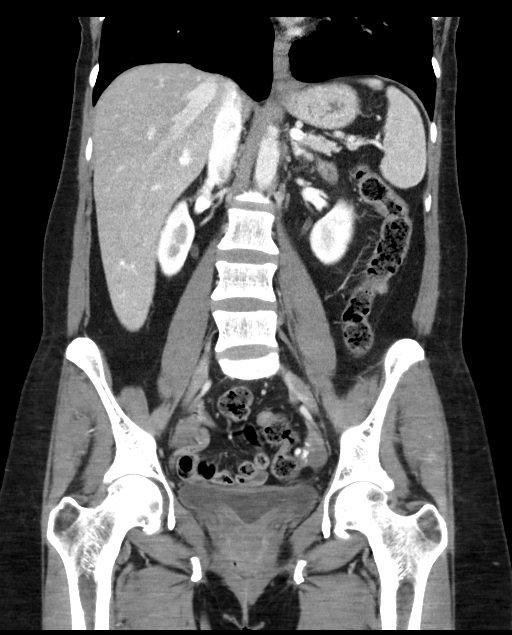

[16 of 46 positions shown; findings below may reference images not displayed]

FINDINGS: Lower chest: Centrilobular emphysema. Bibasilar scarring or
atelectasis. 2 by 3 mm left lower lobe subpleural nodule on image
[DATE], no change from 10/10/2014, considered benign.

Hepatobiliary: Unremarkable

Pancreas: Unremarkable

Spleen: Unremarkable

Adrenals/Urinary Tract: Adrenal glands normal. 1.1 by 1.0 cm fluid
density lesion of the left kidney upper pole compatible with cyst in
stable from 10/10/2014 by my measurements.

Stomach/Bowel: Nondistended ascending and transverse colon. Normal
appendix. No dilated bowel or appreciable significant bowel wall
thickening.

Vascular/Lymphatic: Aortoiliac atherosclerotic vascular disease.

Reproductive: Borderline prominent parametrial vasculature on the
left. Uterus and ovarian contours appear unremarkable.

Other: No supplemental non-categorized findings.

Musculoskeletal: Unremarkable
IMPRESSION: 1. A specific cause for the patient's abdominal pain is not
identified.
2. Aortic Atherosclerosis (T35U4-BSN.N) and Emphysema (T35U4-MRP.T).

## 2020-06-14 ENCOUNTER — Encounter (HOSPITAL_BASED_OUTPATIENT_CLINIC_OR_DEPARTMENT_OTHER): Payer: Self-pay

## 2020-06-14 ENCOUNTER — Other Ambulatory Visit: Payer: Self-pay

## 2020-06-14 ENCOUNTER — Inpatient Hospital Stay (HOSPITAL_BASED_OUTPATIENT_CLINIC_OR_DEPARTMENT_OTHER)
Admission: EM | Admit: 2020-06-14 | Discharge: 2020-06-17 | DRG: 177 | Disposition: A | Discharge status: Home / Self Care | Payer: HRSA Program | Attending: Internal Medicine | Admitting: Internal Medicine

## 2020-06-14 ENCOUNTER — Emergency Department (HOSPITAL_BASED_OUTPATIENT_CLINIC_OR_DEPARTMENT_OTHER): Payer: HRSA Program

## 2020-06-14 DIAGNOSIS — F17210 Nicotine dependence, cigarettes, uncomplicated: Secondary | ICD-10-CM | POA: Diagnosis present

## 2020-06-14 DIAGNOSIS — J1282 Pneumonia due to coronavirus disease 2019: Secondary | ICD-10-CM | POA: Diagnosis present

## 2020-06-14 DIAGNOSIS — Z79899 Other long term (current) drug therapy: Secondary | ICD-10-CM | POA: Diagnosis not present

## 2020-06-14 DIAGNOSIS — Z886 Allergy status to analgesic agent status: Secondary | ICD-10-CM | POA: Diagnosis not present

## 2020-06-14 DIAGNOSIS — U071 COVID-19: Secondary | ICD-10-CM | POA: Diagnosis not present

## 2020-06-14 DIAGNOSIS — Z885 Allergy status to narcotic agent status: Secondary | ICD-10-CM

## 2020-06-14 DIAGNOSIS — K580 Irritable bowel syndrome with diarrhea: Secondary | ICD-10-CM | POA: Diagnosis not present

## 2020-06-14 DIAGNOSIS — J440 Chronic obstructive pulmonary disease with acute lower respiratory infection: Secondary | ICD-10-CM | POA: Diagnosis not present

## 2020-06-14 DIAGNOSIS — E876 Hypokalemia: Secondary | ICD-10-CM | POA: Diagnosis present

## 2020-06-14 DIAGNOSIS — R9431 Abnormal electrocardiogram [ECG] [EKG]: Secondary | ICD-10-CM

## 2020-06-14 LAB — TROPONIN I (HIGH SENSITIVITY)
Troponin I (High Sensitivity): 3 ng/L (ref <18)
Troponin I (High Sensitivity): 3 ng/L (ref <18)

## 2020-06-14 LAB — CBC
HCT: 45.6 % (ref 36.0–46.0)
Hemoglobin: 15.4 g/dL — ABNORMAL HIGH (ref 12.0–15.0)
MCH: 29.2 pg (ref 26.0–34.0)
MCHC: 33.8 g/dL (ref 30.0–36.0)
MCV: 86.5 fL (ref 80.0–100.0)
Platelets: 200 10*3/uL (ref 150–400)
RBC: 5.27 MIL/uL — ABNORMAL HIGH (ref 3.87–5.11)
RDW: 13.7 % (ref 11.5–15.5)
WBC: 7.7 10*3/uL (ref 4.0–10.5)
nRBC: 0 % (ref 0.0–0.2)

## 2020-06-14 LAB — C-REACTIVE PROTEIN: CRP: 7.6 mg/dL — ABNORMAL HIGH

## 2020-06-14 LAB — LACTIC ACID, PLASMA
Lactic Acid, Venous: 0.8 mmol/L (ref 0.5–1.9)
Lactic Acid, Venous: 3.4 mmol/L (ref 0.5–1.9)

## 2020-06-14 LAB — FERRITIN: Ferritin: 136 ng/mL (ref 11–307)

## 2020-06-14 LAB — BASIC METABOLIC PANEL
Anion gap: 14 (ref 5–15)
BUN: <5 mg/dL — ABNORMAL LOW (ref 6–20)
CO2: 24 mmol/L (ref 22–32)
Calcium: 8 mg/dL — ABNORMAL LOW (ref 8.9–10.3)
Chloride: 94 mmol/L — ABNORMAL LOW (ref 98–111)
Creatinine, Ser: 0.65 mg/dL (ref 0.44–1.00)
GFR, Estimated: >60 mL/min (ref >60)
Glucose, Bld: 161 mg/dL — ABNORMAL HIGH (ref 70–99)
Potassium: 2.7 mmol/L — CL (ref 3.5–5.1)
Sodium: 132 mmol/L — ABNORMAL LOW (ref 135–145)

## 2020-06-14 LAB — RESP PANEL BY RT-PCR (FLU A&B, COVID) ARPGX2
Influenza A by PCR: NEGATIVE
Influenza B by PCR: NEGATIVE
SARS Coronavirus 2 by RT PCR: POSITIVE — AB

## 2020-06-14 LAB — FIBRINOGEN: Fibrinogen: 493 mg/dL — ABNORMAL HIGH (ref 210–475)

## 2020-06-14 LAB — PROCALCITONIN: Procalcitonin: <0.1 ng/mL

## 2020-06-14 LAB — MAGNESIUM: Magnesium: 1.8 mg/dL (ref 1.7–2.4)

## 2020-06-14 LAB — D-DIMER, QUANTITATIVE: D-Dimer, Quant: 0.42 ug/mL-FEU (ref 0.00–0.50)

## 2020-06-14 LAB — LACTATE DEHYDROGENASE: LDH: 179 U/L (ref 98–192)

## 2020-06-14 LAB — TRIGLYCERIDES: Triglycerides: 194 mg/dL — ABNORMAL HIGH (ref <150)

## 2020-06-14 MED ORDER — ACETAMINOPHEN 325 MG PO TABS
650.0000 mg | ORAL_TABLET | Freq: Once | ORAL | Status: AC
  Administered 2020-06-14: 650 mg via ORAL
  Filled 2020-06-14: qty 2

## 2020-06-14 MED ORDER — ENOXAPARIN SODIUM 40 MG/0.4ML ~~LOC~~ SOLN
40.0000 mg | SUBCUTANEOUS | Status: DC
  Administered 2020-06-15 – 2020-06-17 (×3): 40 mg via SUBCUTANEOUS
  Filled 2020-06-14 (×3): qty 0.4

## 2020-06-14 MED ORDER — DEXAMETHASONE SODIUM PHOSPHATE 10 MG/ML IJ SOLN
10.0000 mg | Freq: Once | INTRAMUSCULAR | Status: AC
  Administered 2020-06-14: 10 mg via INTRAVENOUS
  Filled 2020-06-14: qty 1

## 2020-06-14 MED ORDER — SODIUM CHLORIDE 0.9 % IV SOLN: INTRAVENOUS | Status: DC | PRN

## 2020-06-14 MED ORDER — POTASSIUM CHLORIDE 10 MEQ/100ML IV SOLN
10.0000 meq | INTRAVENOUS | Status: AC
  Administered 2020-06-14 (×2): 10 meq via INTRAVENOUS
  Filled 2020-06-14 (×2): qty 100

## 2020-06-14 MED ORDER — ONDANSETRON HCL 4 MG PO TABS: 4.0000 mg | ORAL_TABLET | Freq: Four times a day (QID) | ORAL | Status: DC | PRN

## 2020-06-14 MED ORDER — ALBUTEROL SULFATE HFA 108 (90 BASE) MCG/ACT IN AERS: 2.0000 | INHALATION_SPRAY | Freq: Four times a day (QID) | RESPIRATORY_TRACT | Status: DC | PRN

## 2020-06-14 MED ORDER — MAGNESIUM SULFATE 2 GM/50ML IV SOLN
2.0000 g | Freq: Once | INTRAVENOUS | Status: AC
  Administered 2020-06-14: 2 g via INTRAVENOUS
  Filled 2020-06-14: qty 50

## 2020-06-14 MED ORDER — SODIUM CHLORIDE 0.9 % IV SOLN
100.0000 mg | INTRAVENOUS | Status: DC
  Filled 2020-06-14: qty 20

## 2020-06-14 MED ORDER — GABAPENTIN 300 MG PO CAPS
300.0000 mg | ORAL_CAPSULE | Freq: Every day | ORAL | Status: DC
  Administered 2020-06-15 – 2020-06-17 (×3): 300 mg via ORAL
  Filled 2020-06-14 (×4): qty 1

## 2020-06-14 MED ORDER — SODIUM CHLORIDE 0.9 % IV SOLN
100.0000 mg | INTRAVENOUS | Status: AC
  Administered 2020-06-14 – 2020-06-15 (×2): 100 mg via INTRAVENOUS
  Filled 2020-06-14: qty 20

## 2020-06-14 MED ORDER — SODIUM CHLORIDE 0.9 % IV SOLN
100.0000 mg | Freq: Every day | INTRAVENOUS | Status: DC
  Administered 2020-06-16 – 2020-06-17 (×2): 100 mg via INTRAVENOUS
  Filled 2020-06-14 (×3): qty 20

## 2020-06-14 MED ORDER — ACETAMINOPHEN 325 MG PO TABS
650.0000 mg | ORAL_TABLET | Freq: Four times a day (QID) | ORAL | Status: DC | PRN
  Administered 2020-06-14: 650 mg via ORAL
  Filled 2020-06-14: qty 2

## 2020-06-14 MED ORDER — INFLUENZA VAC SPLIT QUAD 0.5 ML IM SUSY: 0.5000 mL | PREFILLED_SYRINGE | INTRAMUSCULAR | Status: DC

## 2020-06-14 MED ORDER — POTASSIUM CHLORIDE CRYS ER 20 MEQ PO TBCR
40.0000 meq | EXTENDED_RELEASE_TABLET | Freq: Once | ORAL | Status: AC
  Administered 2020-06-14: 40 meq via ORAL
  Filled 2020-06-14: qty 2

## 2020-06-14 MED ORDER — ONDANSETRON HCL 4 MG/2ML IJ SOLN
4.0000 mg | Freq: Four times a day (QID) | INTRAMUSCULAR | Status: DC | PRN
  Administered 2020-06-16: 4 mg via INTRAVENOUS
  Filled 2020-06-14: qty 2

## 2020-06-14 MED ORDER — SODIUM CHLORIDE 0.9 % IV SOLN: 100.0000 mg | Freq: Every day | INTRAVENOUS | Status: DC

## 2020-06-14 NOTE — ED Provider Notes (Signed)
[facility] EMERGENCY DEPARTMENT Provider Note   CSN: [id number] Arrival date & time: 06/14/20  1432     History Chief Complaint  Patient presents with  . Shortness of Breath    COVID +    DEZARAI PREW is a 50 y.o. female with PMHx COPD, asthma, IBS, presenting to the ED with complaint of SOB. She states she woke up on Sunday not feeling well. She took a home COVID rapid test and it was positive. She has positive exposure, not vaccinated against COVID. She reports symptoms of fullness sensation in her chest and feeling like she is having trouble taking a deep breath.  She has had fever, headache, cough, sore throat, fatigue and body aches.  Denies GI upset.  Has treated her symptoms with Tylenol and Advil, as well as DayQuil.  Intermittently has used albuterol though very few times, last time was 2 AM.  She states maybe she used albuterol twice yesterday.  No known history of hypokalemia or prolonged QT.  The history is provided by the patient.       Past Medical History:  Diagnosis Date  . Asthma   . COPD (chronic obstructive pulmonary disease) (HCC)   . Endometriosis   . IBS (irritable bowel syndrome)   . Pneumothorax   . Rectal pain     There are no problems to display for this patient.   Past Surgical History:  Procedure Laterality Date  . CHEST TUBE INSERTION  2011   for a couple of days during hospitalization  . ENDOMETRIAL BIOPSY    . LAPAROSCOPIC ENDOMETRIOSIS FULGURATION       OB History    Gravida  5   Para  5   Term  0   Preterm  0   AB  0   Living  5     SAB  0   IAB  0   Ectopic  0   Multiple  0   Live Births  5        Obstetric Comments  All vaginal, full term deliveries        Family History  Problem Relation Age of Onset  . Cancer Mother        breast CA  . Diabetes Mother   . Cancer Father        breast CA, lymphoma  . Diabetes Father   . Cancer Sister     Social History   Tobacco Use  . Smoking  status: Current Every Day Smoker    Packs/day: 1.00    Types: Cigarettes  . Smokeless tobacco: Never Used  Substance Use Topics  . Alcohol use: Not Currently    Alcohol/week: 4.0 standard drinks    Types: 4 Cans of beer per week  . Drug use: No    Home Medications Prior to Admission medications   Medication Sig Start Date End Date Taking? Authorizing Provider  ALBUTEROL SULFATE HFA IN Inhale into the lungs.    [provider]  doxycycline (VIBRAMYCIN) 100 MG capsule TK ONE C PO BID FOR 10 DAYS 05/24/18   [provider]  gabapentin (NEURONTIN) 300 MG capsule Take 300 mg by mouth 3 (three) times daily.  09/03/18   [provider]  HYDROcodone-acetaminophen (NORCO/VICODIN) 5-325 MG tablet Take 1 tablet by mouth every 6 (six) hours as needed. 10/05/18   Raeford Razor, MD  ibuprofen (ADVIL,MOTRIN) 800 MG tablet Take by mouth. 09/11/17   [provider]  metroNIDAZOLE (FLAGYL) 500 MG  tablet Take 1 tablet (500 mg total) by mouth 2 (two) times daily. 09/09/18   Maxwell Caul, PA-C  predniSONE (DELTASONE) 20 MG tablet TK 3 TS PO QD FOR 5 DAYS 05/24/18   [provider]  promethazine (PHENERGAN) 25 MG tablet Take by mouth. 05/13/18   [provider]    Allergies    Oxycodone and Tramadol  Review of Systems   Review of Systems  Constitutional: Positive for fever.  Respiratory: Positive for cough and shortness of breath.   Musculoskeletal: Positive for myalgias (Generalized).  All other systems reviewed and are negative.   Physical Exam Updated Vital Signs BP 114/75 (BP Location: Right Arm)   Pulse 81   Temp 98.9 F (37.2 C) (Oral)   Resp 18   Ht 5\' 4"  (1.626 m)   Wt 65.8 kg   SpO2 98%   BMI 24.89 kg/m   Physical Exam Vitals and nursing note reviewed.  Constitutional:      General: She is not in acute distress.    Appearance: She is well-developed and well-nourished.  HENT:     Head: Normocephalic and atraumatic.  Eyes:      Conjunctiva/sclera: Conjunctivae normal.  Cardiovascular:     Rate and Rhythm: Normal rate and regular rhythm.  Pulmonary:     Effort: Pulmonary effort is normal. No tachypnea or respiratory distress.     Breath sounds: Normal breath sounds.  Abdominal:     General: Bowel sounds are normal.     Palpations: Abdomen is soft.     Tenderness: There is no abdominal tenderness.  Musculoskeletal:     Right lower leg: No edema.     Left lower leg: No edema.  Skin:    General: Skin is warm.  Neurological:     Mental Status: She is alert.  Psychiatric:        Mood and Affect: Mood and affect normal.        Behavior: Behavior normal.     ED Results / Procedures / Treatments   Labs (all labs ordered are listed, but only abnormal results are displayed) Labs Reviewed  BASIC METABOLIC PANEL - Abnormal; Notable for the following components:      Result Value   Sodium 132 (*)    Potassium 2.7 (*)    Chloride 94 (*)    Glucose, Bld 161 (*)    BUN <5 (*)    Calcium 8.0 (*)    All other components within normal limits  CBC - Abnormal; Notable for the following components:   RBC 5.27 (*)    Hemoglobin 15.4 (*)    All other components within normal limits  RESP PANEL BY RT-PCR (FLU A&B, COVID) ARPGX2  MAGNESIUM  TROPONIN I (HIGH SENSITIVITY)  TROPONIN I (HIGH SENSITIVITY)    EKG EKG Interpretation  Date/Time:  Wednesday June 14 2020 14:41:20 EST Ventricular Rate:  94 PR Interval:  136 QRS Duration: 82 QT Interval:  432 QTC Calculation: 540 R Axis:   96 Text Interpretation: Sinus rhythm with occasional Premature ventricular complexes Rightward axis Nonspecific ST and T wave abnormality Prolonged QT Abnormal ECG Confirmed by [id number] [phone]) on 06/14/2020 4:44:47 PM   Radiology DG Chest Portable 1 View  Result Date: 06/14/2020 CLINICAL DATA:  COVID-19 positivity with shortness of breath EXAM: PORTABLE CHEST 1 VIEW COMPARISON:  05/08/2015 FINDINGS: Cardiac shadows within  normal limits. Patient rotation to the right accentuates the mediastinal markings. No focal infiltrate or effusion is seen. No bony  abnormality is noted. IMPRESSION: No acute abnormality seen. Electronically Signed   By: Alcide CleverMark  Lukens M.D.   On: 06/14/2020 15:20    Procedures .Critical Care Performed by: Kharson Rasmusson, [place] N, PA-C Authorized by: Cameron Katayama, [place] N, PA-C   Critical care provider statement:    Critical care time (minutes):  45   Critical care time was exclusive of:  Separately billable procedures and treating other patients and teaching time   Critical care was necessary to treat or prevent imminent or life-threatening deterioration of the following conditions:  Metabolic crisis (hypokalemia with arrhythmia)   Critical care was time spent personally by me on the following activities:  Discussions with consultants, evaluation of patient's response to treatment, examination of patient, ordering and performing treatments and interventions, ordering and review of laboratory studies, ordering and review of radiographic studies, pulse oximetry, re-evaluation of patient's condition, obtaining history from patient or surrogate and review of old charts   I assumed direction of critical care for this patient from another provider in my specialty: no       Medications Ordered in ED Medications  potassium chloride 10 mEq in 100 mL IVPB (10 mEq Intravenous New Bag/Given 06/14/20 1701)  0.9 %  sodium chloride infusion ( Intravenous New Bag/Given 06/14/20 1652)  dexamethasone (DECADRON) injection 10 mg (10 mg Intravenous Given 06/14/20 1656)  potassium chloride SA (KLOR-CON) CR tablet 40 mEq (40 mEq Oral Given 06/14/20 1655)  magnesium sulfate IVPB 2 g 50 mL (2 g Intravenous New Bag/Given 06/14/20 1708)    ED Course  I have reviewed the triage vital signs and the nursing notes.  Pertinent labs & imaging results that were available during my care of the patient were reviewed by me and considered in my  medical decision making (see chart for details).  Clinical Course as of 06/14/20 1820  Wed Jun 14, 2020  1723 ED EKG Qtc 540 [JR]    Clinical Course User Index [JR] Georg Ang, [place] N, PA-C   MDM Rules/Calculators/A&P                         Patient presenting for shortness of breath in the setting of suspected COVID-19 illness.  She took a home Covid test on Sunday, the day her symptoms started, and it was positive.  She also had positive exposure.  She is not vaccinated against COVID.  She has history of COPD and asthma.  Has not been using her albuterol much because she does not like the side effect of tachycardia.  No GI complaints.  On exam, she does have good air movement, speaking in full sentences without difficulty.  O2 sat is normal on room air.  Decadron ordered for shortness of breath   chest x-ray is clear.  Labs are significant for potassium of 2.7.  Hemoglobin is slightly elevated at 15.4, likely due to mild dehydration.  Magnesium level ordered.  Will order replacement for potassium and magnesium.  Considering causes of hypokalemia, she is not on any blood pressure medications which would cause hypokalemia.  She does take albuterol, however considering patient's report of the infrequent use of albuterol due to intolerance of side effects, believe this is likely less likely the cause.   EKG shows QT prolongation.  In comparison of last EKG read in care everywhere from January 2020, QT was documented as normal.  Patient denies any known history of QT prolongation.  COVID PCR is positive. remdesivir ordered.   Patient work-up and  care plan discussed with attending physician Dr. Jacqulyn Bath.  At this time believe admission for potassium repletion and cardiac monitoring is indicated.  Patient is in agreement with this plan.  Will be admitted to hospital service.   Final Clinical Impression(s) / ED Diagnoses Final diagnoses:  Hypokalemia  QT prolongation  COVID-19    Rx / DC  Orders ED Discharge Orders    None       Kiaria Quinnell, [place] N, PA-C 06/15/20 0029    Maia Plan, MD 06/22/20 1016

## 2020-06-14 NOTE — Progress Notes (Addendum)
Date and time results received: 06/14/20 2356  Test: Lactic Critical Value: 3.4  Name of Provider Notified: [facility] E. Zara Council admitting MD J Gardener  Orders Received? Or Actions Taken?: No new orders at this time

## 2020-06-14 NOTE — Plan of Care (Signed)
50 yo F known COPD, asthma, IBS, COVID positive tested positive on Sunday, not vaccinated Have been using Albuterol and dayQuil Presented to [facility] At some point was hypoxic down to 89% but transient K 2.7 Mg 1.8 QTC 540 CXR wNL  Er ordered 40 PO and 2 runs Also given Decadron Accepted as obs for Potassium repletion COVID PCR pending  Chenika Nevils 6:27 PM

## 2020-06-14 NOTE — Progress Notes (Incomplete)
Critical Lab result: Lactic Acid 3.4.

## 2020-06-14 NOTE — ED Notes (Signed)
Called [facility] to transport patient to [phone]

## 2020-06-14 NOTE — ED Triage Notes (Signed)
Pt tested positive for COVID on Sunday with a home test. Has headache, fever, shortness of breath, "chest fullness", cough. Been taking inhalers without relief. Chest pain with deep breath. Not vaccinated, denies hx of blood clots

## 2020-06-15 DIAGNOSIS — E876 Hypokalemia: Secondary | ICD-10-CM | POA: Diagnosis present

## 2020-06-15 DIAGNOSIS — Z79899 Other long term (current) drug therapy: Secondary | ICD-10-CM | POA: Diagnosis not present

## 2020-06-15 DIAGNOSIS — J1282 Pneumonia due to coronavirus disease 2019: Secondary | ICD-10-CM | POA: Diagnosis present

## 2020-06-15 DIAGNOSIS — U071 COVID-19: Secondary | ICD-10-CM | POA: Diagnosis present

## 2020-06-15 DIAGNOSIS — F17210 Nicotine dependence, cigarettes, uncomplicated: Secondary | ICD-10-CM | POA: Diagnosis present

## 2020-06-15 DIAGNOSIS — J440 Chronic obstructive pulmonary disease with acute lower respiratory infection: Secondary | ICD-10-CM | POA: Diagnosis present

## 2020-06-15 DIAGNOSIS — R9431 Abnormal electrocardiogram [ECG] [EKG]: Secondary | ICD-10-CM

## 2020-06-15 DIAGNOSIS — K580 Irritable bowel syndrome with diarrhea: Secondary | ICD-10-CM | POA: Diagnosis present

## 2020-06-15 DIAGNOSIS — Z885 Allergy status to narcotic agent status: Secondary | ICD-10-CM | POA: Diagnosis not present

## 2020-06-15 DIAGNOSIS — Z886 Allergy status to analgesic agent status: Secondary | ICD-10-CM | POA: Diagnosis not present

## 2020-06-15 LAB — COMPREHENSIVE METABOLIC PANEL
ALT: 15 U/L (ref 0–44)
AST: 27 U/L (ref 15–41)
Albumin: 3 g/dL — ABNORMAL LOW (ref 3.5–5.0)
Alkaline Phosphatase: 108 U/L (ref 38–126)
Anion gap: 10 (ref 5–15)
BUN: <5 mg/dL — ABNORMAL LOW (ref 6–20)
CO2: 24 mmol/L (ref 22–32)
Calcium: 8.2 mg/dL — ABNORMAL LOW (ref 8.9–10.3)
Chloride: 101 mmol/L (ref 98–111)
Creatinine, Ser: 0.61 mg/dL (ref 0.44–1.00)
GFR, Estimated: >60 mL/min (ref >60)
Glucose, Bld: 180 mg/dL — ABNORMAL HIGH (ref 70–99)
Potassium: 3.8 mmol/L (ref 3.5–5.1)
Sodium: 135 mmol/L (ref 135–145)
Total Bilirubin: 0.3 mg/dL (ref 0.3–1.2)
Total Protein: 6.3 g/dL — ABNORMAL LOW (ref 6.5–8.1)

## 2020-06-15 LAB — CBC WITH DIFFERENTIAL/PLATELET
Abs Immature Granulocytes: 0.02 10*3/uL (ref 0.00–0.07)
Basophils Absolute: 0 10*3/uL (ref 0.0–0.1)
Basophils Relative: 0 %
Eosinophils Absolute: 0 10*3/uL (ref 0.0–0.5)
Eosinophils Relative: 0 %
HCT: 41.7 % (ref 36.0–46.0)
Hemoglobin: 13.7 g/dL (ref 12.0–15.0)
Immature Granulocytes: 0 %
Lymphocytes Relative: 13 %
Lymphs Abs: 0.6 10*3/uL — ABNORMAL LOW (ref 0.7–4.0)
MCH: 29.3 pg (ref 26.0–34.0)
MCHC: 32.9 g/dL (ref 30.0–36.0)
MCV: 89.3 fL (ref 80.0–100.0)
Monocytes Absolute: 0.2 10*3/uL (ref 0.1–1.0)
Monocytes Relative: 4 %
Neutro Abs: 3.9 10*3/uL (ref 1.7–7.7)
Neutrophils Relative %: 83 %
Platelets: 197 10*3/uL (ref 150–400)
RBC: 4.67 MIL/uL (ref 3.87–5.11)
RDW: 13.7 % (ref 11.5–15.5)
WBC: 4.8 10*3/uL (ref 4.0–10.5)
nRBC: 0 % (ref 0.0–0.2)

## 2020-06-15 LAB — C-REACTIVE PROTEIN: CRP: 5.5 mg/dL — ABNORMAL HIGH (ref <1.0)

## 2020-06-15 LAB — LACTIC ACID, PLASMA
Lactic Acid, Venous: 3.7 mmol/L (ref 0.5–1.9)
Lactic Acid, Venous: 2.8 mmol/L (ref 0.5–1.9)

## 2020-06-15 LAB — HIV ANTIBODY (ROUTINE TESTING W REFLEX): HIV Screen 4th Generation wRfx: NONREACTIVE

## 2020-06-15 LAB — D-DIMER, QUANTITATIVE: D-Dimer, Quant: 0.7 ug/mL-FEU — ABNORMAL HIGH (ref 0.00–0.50)

## 2020-06-15 MED ORDER — NICOTINE 21 MG/24HR TD PT24
21.0000 mg | MEDICATED_PATCH | Freq: Every day | TRANSDERMAL | Status: DC
  Administered 2020-06-15 – 2020-06-17 (×3): 21 mg via TRANSDERMAL
  Filled 2020-06-15 (×3): qty 1

## 2020-06-15 MED ORDER — LACTATED RINGERS IV SOLN: Freq: Once | INTRAVENOUS | Status: AC

## 2020-06-15 MED ORDER — OXYCODONE HCL 5 MG PO TABS
5.0000 mg | ORAL_TABLET | ORAL | Status: DC | PRN
  Administered 2020-06-15 – 2020-06-17 (×6): 5 mg via ORAL
  Filled 2020-06-15 (×6): qty 1

## 2020-06-15 MED ORDER — ACETAMINOPHEN 325 MG PO TABS
650.0000 mg | ORAL_TABLET | Freq: Four times a day (QID) | ORAL | Status: DC | PRN
  Administered 2020-06-15: 650 mg via ORAL
  Filled 2020-06-15: qty 2

## 2020-06-15 NOTE — Progress Notes (Signed)
50 year old female admitted this morning by one of my partners for Covid pneumonia/hypoxia.  Started on remdesivir and steroids.  She has a history of COPD and asthma and IBS.  And she is unvaccinated.  She continues to be short of breath.  We will continue current treatment which has been started on the time of admission remdesivir and steroids.  Ambulatory pulse oxygen saturation.  Chart and labs reviewed.

## 2020-06-15 NOTE — Plan of Care (Signed)

## 2020-06-15 NOTE — Progress Notes (Signed)
Date and time results received: 06/15/20  @ 118  Test: Lactic Critical Value: 3.7  Name of Provider Notified: E. Ouma  Orders Received? Or Actions Taken?: No new orders at this time

## 2020-06-15 NOTE — H&P (Signed)
History and Physical    Amanda Floyd JEH:[id number] DOB: Feb 23, 1971 DOA: 06/14/2020  PCP: [facility]  Patient coming from: Home  I have personally briefly reviewed patient's old medical records in [facility]  Chief Complaint: SOB, COVID  HPI: Amanda Floyd is a 50 y.o. female with medical history significant of COPD, asthma, IBS.  Pt unvaccinated.  Pt presents to ED with c/o SOB.  SOB onset Sunday when she woke up not feeling well.  Took home COVID rapid test which was positive.  She has positive exposure.  Pt reports SOB.  Today developed diarrhea onset.  Has fever, headache, cough, sore throat, body aches.  No N/V.   ED Course: K 2.7.  No O2 requirement, CXR neg, COVID+  Pt started on remdesivir, K replacement ordered, hospitalist asked to admit.   Review of Systems: As per HPI, otherwise all review of systems negative.  Past Medical History:  Diagnosis Date  . Asthma   . COPD (chronic obstructive pulmonary disease) (HCC)   . Endometriosis   . IBS (irritable bowel syndrome)   . Pneumothorax   . Rectal pain     Past Surgical History:  Procedure Laterality Date  . CHEST TUBE INSERTION  2011   for a couple of days during hospitalization  . ENDOMETRIAL BIOPSY    . LAPAROSCOPIC ENDOMETRIOSIS FULGURATION       reports that she has been smoking cigarettes. She has been smoking about 1.00 pack per day. She has never used smokeless tobacco. She reports previous alcohol use of about 4.0 standard drinks of alcohol per week. She reports that she does not use drugs.  Allergies  Allergen Reactions  . Oxycodone Itching  . Tramadol Other (See Comments)    arrhythmia     Family History  Problem Relation Age of Onset  . Cancer Mother        breast CA  . Diabetes Mother   . Cancer Father        breast CA, lymphoma  . Diabetes Father   . Cancer Sister      Prior to Admission medications   Medication Sig Start Date End Date Taking? Authorizing  Provider  albuterol (VENTOLIN HFA) 108 (90 Base) MCG/ACT inhaler Inhale 2 puffs into the lungs every 6 (six) hours as needed for wheezing or shortness of breath.   Yes [provider]  BLACK COHOSH PO Take 1 tablet by mouth at bedtime.   Yes [provider]  gabapentin (NEURONTIN) 300 MG capsule Take 300 mg by mouth daily. 09/03/18  Yes [provider]  ibuprofen (ADVIL) 200 MG tablet Take 200 mg by mouth every 6 (six) hours as needed for moderate pain.   Yes [provider]    Physical Exam: Vitals:   06/14/20 1900 06/14/20 2103 06/14/20 2200 06/14/20 2206  BP: 120/85 110/67  111/77  Pulse: 94 83  68  Resp: 15 13  16   Temp:    98 F (36.7 C)  TempSrc:    Oral  SpO2: 94% 98%  96%  Weight:   67.9 kg   Height:   5\' 4"  (1.626 m)     Constitutional: NAD, calm, comfortable Eyes: PERRL, lids and conjunctivae normal ENMT: Mucous membranes are moist. Posterior pharynx clear of any exudate or lesions.Normal dentition.  Neck: normal, supple, no masses, no thyromegaly Respiratory: clear to auscultation bilaterally, no wheezing, no crackles. Normal respiratory effort. No accessory muscle use.  Cardiovascular: Regular rate and rhythm, no  murmurs / rubs / gallops. No extremity edema. 2+ pedal pulses. No carotid bruits.  Abdomen: no tenderness, no masses palpated. No hepatosplenomegaly. Bowel sounds positive.  Musculoskeletal: no clubbing / cyanosis. No joint deformity upper and lower extremities. Good ROM, no contractures. Normal muscle tone.  Skin: no rashes, lesions, ulcers. No induration Neurologic: CN 2-12 grossly intact. Sensation intact, DTR normal. Strength 5/5 in all 4.  Psychiatric: Normal judgment and insight. Alert and oriented x 3. Normal mood.    Labs on Admission: I have personally reviewed following labs and imaging studies  CBC: Recent Labs  Lab 06/14/20 1455  WBC 7.7  HGB 15.4*  HCT 45.6  MCV 86.5  PLT 200   Basic Metabolic  Panel: Recent Labs  Lab 06/14/20 1455  NA 132*  K 2.7*  CL 94*  CO2 24  GLUCOSE 161*  BUN <5*  CREATININE 0.65  CALCIUM 8.0*  MG 1.8   GFR: Estimated Creatinine Clearance: 80.6 mL/min (by C-G formula based on SCr of 0.65 mg/dL). Liver Function Tests: No results for input(s): AST, ALT, ALKPHOS, BILITOT, PROT, ALBUMIN in the last 168 hours. No results for input(s): LIPASE, AMYLASE in the last 168 hours. No results for input(s): AMMONIA in the last 168 hours. Coagulation Profile: No results for input(s): INR, PROTIME in the last 168 hours. Cardiac Enzymes: No results for input(s): CKTOTAL, CKMB, CKMBINDEX, TROPONINI in the last 168 hours. BNP (last 3 results) No results for input(s): PROBNP in the last 8760 hours. HbA1C: No results for input(s): HGBA1C in the last 72 hours. CBG: No results for input(s): GLUCAP in the last 168 hours. Lipid Profile: Recent Labs    06/14/20 1836  TRIG 194*   Thyroid Function Tests: No results for input(s): TSH, T4TOTAL, FREET4, T3FREE, THYROIDAB in the last 72 hours. Anemia Panel: Recent Labs    06/14/20 1836  FERRITIN 136   Urine analysis:    Component Value Date/Time   COLORURINE YELLOW 09/09/2018 1156   APPEARANCEUR CLEAR 09/09/2018 1156   LABSPEC 1.015 09/09/2018 1156   PHURINE 6.5 09/09/2018 1156   GLUCOSEU NEGATIVE 09/09/2018 1156   HGBUR MODERATE (A) 09/09/2018 1156   BILIRUBINUR NEGATIVE 09/09/2018 1156   KETONESUR NEGATIVE 09/09/2018 1156   PROTEINUR NEGATIVE 09/09/2018 1156   UROBILINOGEN 0.2 10/10/2014 1935   NITRITE NEGATIVE 09/09/2018 1156   LEUKOCYTESUR SMALL (A) 09/09/2018 1156    Radiological Exams on Admission: DG Chest Portable 1 View  Result Date: 06/14/2020 CLINICAL DATA:  COVID-19 positivity with shortness of breath EXAM: PORTABLE CHEST 1 VIEW COMPARISON:  05/08/2015 FINDINGS: Cardiac shadows within normal limits. Patient rotation to the right accentuates the mediastinal markings. No focal infiltrate or  effusion is seen. No bony abnormality is noted. IMPRESSION: No acute abnormality seen. Electronically Signed   By: Alcide Clever M.D.   On: 06/14/2020 15:20    EKG: Independently reviewed.  Assessment/Plan Principal Problem:   COVID-19 virus infection Active Problems:   Hypokalemia    1. COVID-19 1. COVID pathway 2. remdesivir 3. No O2 requirement at this time 4. Daily labs 2. Hypokalemia - 1. Replace K 2. IVF: LR at 100 cc/hr for 1 L  DVT prophylaxis: Lovenox Code Status: Full Family Communication: No family in room Disposition Plan: Home, possibly tomorrow Consults called: None Admission status: Place in 40    Kemiah Booz M. DO [facility]  How to contact the [facility] Attending or Consulting provider 7A - 7P or covering provider during after hours 7P -7A, for this patient?  1. Check the care team in [facility] and look for a) attending/consulting [facility] provider listed and b) the [facility] team listed 2. Log into [email or web address]  Amion Physician Scheduling and messaging for groups and whole hospitals  On call and physician scheduling software for group practices, residents, hospitalists and other medical providers for call, clinic, rotation and shift schedules. On[employer] is a hospital-wide system for scheduling doctors and paging doctors on call. EasyPlot is for scientific plotting and data analysis.  [email or web address]  and use [place]'s universal password to access. If you do not have the password, please contact the hospital operator.  3. Locate the [facility] provider you are looking for under [facility] and page to a number that you can be directly reached. 4. If you still have difficulty reaching the provider, please page the [facility] (Director on Call) for the Hospitalists listed on amion for assistance.  06/15/2020, 12:49 AM

## 2020-06-16 LAB — CBC WITH DIFFERENTIAL/PLATELET
Abs Immature Granulocytes: 0.01 10*3/uL (ref 0.00–0.07)
Basophils Absolute: 0 10*3/uL (ref 0.0–0.1)
Basophils Relative: 0 %
Eosinophils Absolute: 0 10*3/uL (ref 0.0–0.5)
Eosinophils Relative: 1 %
HCT: 40.9 % (ref 36.0–46.0)
Hemoglobin: 13.2 g/dL (ref 12.0–15.0)
Immature Granulocytes: 0 %
Lymphocytes Relative: 32 %
Lymphs Abs: 1.7 10*3/uL (ref 0.7–4.0)
MCH: 29 pg (ref 26.0–34.0)
MCHC: 32.3 g/dL (ref 30.0–36.0)
MCV: 89.9 fL (ref 80.0–100.0)
Monocytes Absolute: 0.4 10*3/uL (ref 0.1–1.0)
Monocytes Relative: 7 %
Neutro Abs: 3.1 10*3/uL (ref 1.7–7.7)
Neutrophils Relative %: 60 %
Platelets: 165 10*3/uL (ref 150–400)
RBC: 4.55 MIL/uL (ref 3.87–5.11)
RDW: 13.9 % (ref 11.5–15.5)
WBC: 5.3 10*3/uL (ref 4.0–10.5)
nRBC: 0 % (ref 0.0–0.2)

## 2020-06-16 LAB — COMPREHENSIVE METABOLIC PANEL
ALT: 11 U/L (ref 0–44)
AST: 19 U/L (ref 15–41)
Albumin: 2.9 g/dL — ABNORMAL LOW (ref 3.5–5.0)
Alkaline Phosphatase: 97 U/L (ref 38–126)
Anion gap: 8 (ref 5–15)
BUN: 5 mg/dL — ABNORMAL LOW (ref 6–20)
CO2: 27 mmol/L (ref 22–32)
Calcium: 8.1 mg/dL — ABNORMAL LOW (ref 8.9–10.3)
Chloride: 105 mmol/L (ref 98–111)
Creatinine, Ser: 0.73 mg/dL (ref 0.44–1.00)
GFR, Estimated: >60 mL/min (ref >60)
Glucose, Bld: 103 mg/dL — ABNORMAL HIGH (ref 70–99)
Potassium: 3 mmol/L — ABNORMAL LOW (ref 3.5–5.1)
Sodium: 140 mmol/L (ref 135–145)
Total Bilirubin: 0.3 mg/dL (ref 0.3–1.2)
Total Protein: 6.1 g/dL — ABNORMAL LOW (ref 6.5–8.1)

## 2020-06-16 LAB — C-REACTIVE PROTEIN: CRP: 1.7 mg/dL — ABNORMAL HIGH (ref <1.0)

## 2020-06-16 LAB — D-DIMER, QUANTITATIVE: D-Dimer, Quant: 0.48 ug/mL-FEU (ref 0.00–0.50)

## 2020-06-16 MED ORDER — SALINE SPRAY 0.65 % NA SOLN
1.0000 | NASAL | Status: DC | PRN
  Administered 2020-06-16: 1 via NASAL
  Filled 2020-06-16: qty 44

## 2020-06-16 NOTE — Progress Notes (Signed)
SATURATION QUALIFICATIONS: (This note is used to comply with regulatory documentation for home oxygen)  Patient Saturations on Room Air at Rest =97 %  Patient Saturations on Room Air while Ambulating = 92-96%  Patient Saturations on 0 Liters of oxygen while Ambulating =   Please briefly explain why patient needs home oxygen: Pt does not require oxygen at this time.

## 2020-06-16 NOTE — Progress Notes (Signed)
PROGRESS NOTE    Amanda Floyd  YTK:[id number] DOB: Oct 15, 1970 DOA: 06/14/2020 PCP: [facility]    Brief Narrative: HPI per Dr. Julian Reil on 06/15/2020. Amanda Floyd is a 50 y.o. female with medical history significant of COPD, asthma, IBS.  Pt unvaccinated.  Pt presents to ED with c/o SOB.  SOB onset Sunday when she woke up not feeling well.  Took home COVID rapid test which was positive.  She has positive exposure.  Pt reports SOB.  Today developed diarrhea onset.  Has fever, headache, cough, sore throat, body aches.  No N/V.   ED Course: K 2.7.  No O2 requirement, CXR neg, COVID+  Pt started on remdesivir, K replacement ordered, hospitalist asked to admit.   Assessment & Plan:   Principal Problem:   COVID-19 virus infection Active Problems:   Hypokalemia   Pneumonia due to COVID-19 virus   #1 COVID-19-patient was started on remdesivir.  She will be getting her second dose today.  Plan on discharge tomorrow after the third dose.  Continue supportive treatment incentive spirometry and inhalers.  Patient has history of asthma and COPD.  #2 tobacco abuse continue nicotine patch  Estimated body mass index is 25.7 kg/m as calculated from the following:   Height as of this encounter: 5\' 4"  (1.626 m).   Weight as of this encounter: 67.9 kg.  DVT prophylaxis: lovenox Code Status:full Family Communication none  Disposition Plan:  Status is: Inpatient  Dispo: The patient is from: Home              Anticipated d/c is to: Home              Patient currently is not medically stable to d/c.   Difficult to place patient na   Consultants: none  Procedures:none Antimicrobials:none Subjective: Resting in bed c/o sob and doe with minimal activity  Objective: Vitals:   06/15/20 1006 06/15/20 1548 06/15/20 2055 06/16/20 0552  BP: 137/76 122/72 126/86 128/83  Pulse: 85 75 79 78  Resp: 15 16 18 18   Temp: (!) 97.5 F (36.4 C) 98.5 F (36.9 C) 97.9 F (36.6  C) 98.4 F (36.9 C)  TempSrc: Oral Oral Oral Oral  SpO2: 93% 90% 98% 93%  Weight:      Height:        Intake/Output Summary (Last 24 hours) at 06/16/2020 1148 Last data filed at 06/15/2020 2200 Gross per 24 hour  Intake 2000 ml  Output --  Net 2000 ml   Filed Weights   06/14/20 1442 06/14/20 2200  Weight: 65.8 kg 67.9 kg    Examination:  General exam: Appears calm and comfortable  Respiratory system: rhonchi to auscultation. Respiratory effort normal. Cardiovascular system: S1 & S2 heard, RRR. No JVD, murmurs, rubs, gallops or clicks. No pedal edema. Gastrointestinal system: Abdomen is nondistended, soft and nontender. No organomegaly or masses felt. Normal bowel sounds heard. Central nervous system: Alert and oriented. No focal neurological deficits. Extremities: Symmetric 5 x 5 power. Skin: No rashes, lesions or ulcers Psychiatry: Judgement and insight appear normal. Mood & affect appropriate.     Data Reviewed: I have personally reviewed following labs and imaging studies  CBC: Recent Labs  Lab 06/14/20 1455 06/15/20 0401 06/16/20 0407  WBC 7.7 4.8 5.3  NEUTROABS  --  3.9 3.1  HGB 15.4* 13.7 13.2  HCT 45.6 41.7 40.9  MCV 86.5 89.3 89.9  PLT 200 197 165   Basic Metabolic Panel: Recent Labs  Lab 06/14/20  1455 06/15/20 0401 06/16/20 0407  NA 132* 135 140  K 2.7* 3.8 3.0*  CL 94* 101 105  CO2 24 24 27   GLUCOSE 161* 180* 103*  BUN <5* <5* 5*  CREATININE 0.65 0.61 0.73  CALCIUM 8.0* 8.2* 8.1*  MG 1.8  --   --    GFR: Estimated Creatinine Clearance: 80.6 mL/min (by C-G formula based on SCr of 0.73 mg/dL). Liver Function Tests: Recent Labs  Lab 06/15/20 0401 06/16/20 0407  AST 27 19  ALT 15 11  ALKPHOS 108 97  BILITOT 0.3 0.3  PROT 6.3* 6.1*  ALBUMIN 3.0* 2.9*   No results for input(s): LIPASE, AMYLASE in the last 168 hours. No results for input(s): AMMONIA in the last 168 hours. Coagulation Profile: No results for input(s): INR, PROTIME in the  last 168 hours. Cardiac Enzymes: No results for input(s): CKTOTAL, CKMB, CKMBINDEX, TROPONINI in the last 168 hours. BNP (last 3 results) No results for input(s): PROBNP in the last 8760 hours. HbA1C: No results for input(s): HGBA1C in the last 72 hours. CBG: No results for input(s): GLUCAP in the last 168 hours. Lipid Profile: Recent Labs    06/14/20 1836  TRIG 194*   Thyroid Function Tests: No results for input(s): TSH, T4TOTAL, FREET4, T3FREE, THYROIDAB in the last 72 hours. Anemia Panel: Recent Labs    06/14/20 1836  FERRITIN 136   Sepsis Labs: Recent Labs  Lab 06/14/20 1836 06/14/20 2202 06/15/20 0034 06/15/20 0401  PROCALCITON <0.10  --   --   --   LATICACIDVEN 0.8 3.4* 3.7* 2.8*    Recent Results (from the past 240 hour(s))  Resp Panel by RT-PCR (Flu A&B, Covid) Nasopharyngeal Swab     Status: Abnormal   Collection Time: 06/14/20  5:14 PM   Specimen: Nasopharyngeal Swab; Nasopharyngeal(NP) swabs in vial transport medium  Result Value Ref Range Status   SARS Coronavirus 2 by RT PCR POSITIVE (A) NEGATIVE Final    Comment: RESULT CALLED TO, READ BACK BY AND VERIFIED WITH: 08/14/20 RN AT 1909 ON 06/14/20 BY I.SUGUT (NOTE) SARS-CoV-2 target nucleic acids are DETECTED.  The SARS-CoV-2 RNA is generally detectable in upper respiratory specimens during the acute phase of infection. Positive results are indicative of the presence of the identified virus, but do not rule out bacterial infection or co-infection with other pathogens not detected by the test. Clinical correlation with patient history and other diagnostic information is necessary to determine patient infection status. The expected result is Negative.  Fact Sheet for Patients: 08/14/20  Fact Sheet for Healthcare Providers: BloggerCourse.com  This test is not yet approved or cleared by the [place] FDA and  has been authorized for  detection and/or diagnosis of SARS-CoV-2 by FDA under an Emergency Use Authorization (EUA).  This EUA will remain in effect (meaning this  test can be used) for the duration of  the COVID-19 declaration under Section 564(b)(1) of the Act, 21 U.S.C. section 360bbb-3(b)(1), unless the authorization is terminated or revoked sooner.     Influenza A by PCR NEGATIVE NEGATIVE Final   Influenza B by PCR NEGATIVE NEGATIVE Final    Comment: (NOTE) The Xpert Xpress SARS-CoV-2/FLU/RSV plus assay is intended as an aid in the diagnosis of influenza from Nasopharyngeal swab specimens and should not be used as a sole basis for treatment. Nasal washings and aspirates are unacceptable for Xpert Xpress SARS-CoV-2/FLU/RSV testing.  Fact Sheet for Patients: [place]  Fact Sheet for Healthcare Providers: BloggerCourse.com  This test is  not yet approved or cleared by the [place] and has been authorized for detection and/or diagnosis of SARS-CoV-2 by FDA under an Emergency Use Authorization (EUA). This EUA will remain in effect (meaning this test can be used) for the duration of the COVID-19 declaration under Section 564(b)(1) of the Act, 21 U.S.C. section 360bbb-3(b)(1), unless the authorization is terminated or revoked.  Performed at [facility], [address] [place], [place] [address]   Blood Culture (routine x 2)     Status: None (Preliminary result)   Collection Time: 06/14/20  6:50 PM   Specimen: BLOOD LEFT HAND  Result Value Ref Range Status   Specimen Description   Final    BLOOD LEFT HAND Performed at [facility], [address] [place], [place] [address]    Special Requests   Final    BOTTLES DRAWN AEROBIC AND ANAEROBIC Blood Culture adequate volume Performed at [facility], [address] [place], [place] [address]    Culture   Final    NO GROWTH 2 DAYS Performed at [facility], [address] [place], [place] [address]    Report Status PENDING  Incomplete  Blood Culture (routine x 2)     Status: None (Preliminary result)   Collection Time: 06/14/20  7:39 PM   Specimen: BLOOD  Result Value Ref Range Status   Specimen Description   Final    BLOOD LEFT ANTECUBITAL Performed at [facility], [address] [place], [place] [address]    Special Requests   Final    BOTTLES DRAWN AEROBIC AND ANAEROBIC Blood Culture adequate volume Performed at [facility], [address] [place], [place] [address]    Culture   Final    NO GROWTH 2 DAYS Performed at [facility], [address] [place], [place] [address]    Report Status PENDING  Incomplete         Radiology Studies: DG Chest Portable 1 View  Result Date: 06/14/2020 CLINICAL DATA:  COVID-19 positivity with shortness of breath EXAM: PORTABLE CHEST 1 VIEW COMPARISON:  05/08/2015 FINDINGS: Cardiac shadows within normal limits. Patient rotation to the right accentuates the mediastinal markings. No focal infiltrate or effusion is seen. No bony abnormality is noted. IMPRESSION: No acute abnormality seen. Electronically Signed   By: Alcide Clever M.D.   On: 06/14/2020 15:20        Scheduled Meds: . enoxaparin (LOVENOX) injection  40 mg Subcutaneous Q24H  . gabapentin  300 mg Oral Daily  . influenza vac split quadrivalent PF  0.5 mL Intramuscular Tomorrow-1000  . nicotine  21 mg Transdermal Daily   Continuous Infusions: . sodium chloride 100 mL/hr at 06/15/20 1045  . remdesivir 100 mg in NS 100 mL 100 mg (06/16/20 0842)     LOS: 1 day     Alwyn Ren, MD 06/16/2020, 11:48 AM

## 2020-06-16 NOTE — TOC Progression Note (Signed)
Transition of Care [facility]) - Progression Note    Patient Details  Name: DARLENE BROZOWSKI MRN: [id number] Date of Birth: 18-Jun-1970  [facility] CM/SW Contact  Geni Bers, RN Phone Number: 06/16/2020, 4:09 PM  Clinical Narrative:     Pt from with spouse. TOC will continue to follow for discharge needs.  Expected Discharge Plan: Home/Self Care Barriers to Discharge: No Barriers Identified  Expected Discharge Plan and Services Expected Discharge Plan: Home/Self Care       Living arrangements for the past 2 months: Single Family Home                                       Social Determinants of Health (SDOH) Interventions    Readmission Risk Interventions No flowsheet data found.

## 2020-06-17 LAB — COMPREHENSIVE METABOLIC PANEL
ALT: 12 U/L (ref 0–44)
AST: 20 U/L (ref 15–41)
Albumin: 3 g/dL — ABNORMAL LOW (ref 3.5–5.0)
Alkaline Phosphatase: 97 U/L (ref 38–126)
Anion gap: 8 (ref 5–15)
BUN: <5 mg/dL — ABNORMAL LOW (ref 6–20)
CO2: 27 mmol/L (ref 22–32)
Calcium: 8.1 mg/dL — ABNORMAL LOW (ref 8.9–10.3)
Chloride: 101 mmol/L (ref 98–111)
Creatinine, Ser: 0.66 mg/dL (ref 0.44–1.00)
GFR, Estimated: >60 mL/min (ref >60)
Glucose, Bld: 102 mg/dL — ABNORMAL HIGH (ref 70–99)
Potassium: 3.2 mmol/L — ABNORMAL LOW (ref 3.5–5.1)
Sodium: 136 mmol/L (ref 135–145)
Total Bilirubin: 0.6 mg/dL (ref 0.3–1.2)
Total Protein: 6.4 g/dL — ABNORMAL LOW (ref 6.5–8.1)

## 2020-06-17 LAB — CBC WITH DIFFERENTIAL/PLATELET
Abs Immature Granulocytes: 0.04 10*3/uL (ref 0.00–0.07)
Basophils Absolute: 0 10*3/uL (ref 0.0–0.1)
Basophils Relative: 0 %
Eosinophils Absolute: 0 10*3/uL (ref 0.0–0.5)
Eosinophils Relative: 0 %
HCT: 43 % (ref 36.0–46.0)
Hemoglobin: 13.6 g/dL (ref 12.0–15.0)
Immature Granulocytes: 1 %
Lymphocytes Relative: 28 %
Lymphs Abs: 1.9 10*3/uL (ref 0.7–4.0)
MCH: 28.8 pg (ref 26.0–34.0)
MCHC: 31.6 g/dL (ref 30.0–36.0)
MCV: 90.9 fL (ref 80.0–100.0)
Monocytes Absolute: 0.6 10*3/uL (ref 0.1–1.0)
Monocytes Relative: 8 %
Neutro Abs: 4.1 10*3/uL (ref 1.7–7.7)
Neutrophils Relative %: 63 %
Platelets: 171 10*3/uL (ref 150–400)
RBC: 4.73 MIL/uL (ref 3.87–5.11)
RDW: 13.9 % (ref 11.5–15.5)
WBC: 6.6 10*3/uL (ref 4.0–10.5)
nRBC: 0 % (ref 0.0–0.2)

## 2020-06-17 LAB — D-DIMER, QUANTITATIVE: D-Dimer, Quant: 0.52 ug/mL-FEU — ABNORMAL HIGH (ref 0.00–0.50)

## 2020-06-17 LAB — C-REACTIVE PROTEIN: CRP: 3.8 mg/dL — ABNORMAL HIGH (ref <1.0)

## 2020-06-17 MED ORDER — NICOTINE 21 MG/24HR TD PT24: 21.0000 mg | MEDICATED_PATCH | Freq: Every day | TRANSDERMAL | 0 refills | Status: DC

## 2020-06-17 NOTE — Discharge Summary (Signed)
Physician Discharge Summary  Amanda Floyd YCX:[id number] DOB: 07-04-1970 DOA: 06/14/2020  [facility]P: [facility]  Admit date: 06/14/2020 Discharge date: 06/17/2020  Admitted From: Home Disposition: Home  Recommendations for Outpatient Follow-up:  1. Follow up with PCP in 1-2 weeks 2. Please obtain BMP/CBC in one week  Home Health: None Equipment/Devices: None Discharge Condition: Stable CODE STATUS: Full code Diet recommendation: Cardiac Brief/Interim Summary: 50 year old female with history of asthma COPD IBS smoker unvaccinated admitted as she was Covid positive.    Discharge Diagnoses:  Principal Problem:   COVID-19 virus infection Active Problems:   Hypokalemia   Pneumonia due to COVID-19 virus    #1 Covid positive in a patient with history of COPD asthma and smoker patient was treated with remdesivir 3 doses.  Her condition remained stable throughout the hospital stay.  She ambulated without dropping saturation prior to discharge.  She remained above 92% on room air Estimated body mass index is 25.7 kg/m as calculated from the following:   Height as of this encounter: 5\' 4"  (1.626 m).   Weight as of this encounter: 67.9 kg.  Discharge Instructions  Discharge Instructions    Diet - low sodium heart healthy   Complete by: As directed    Increase activity slowly   Complete by: As directed      Allergies as of 06/17/2020      Reactions   Oxycodone Itching   Tramadol Other (See Comments)   arrhythmia       Medication List    STOP taking these medications   ibuprofen 200 MG tablet Commonly known as: ADVIL     TAKE these medications   albuterol 108 (90 Base) MCG/ACT inhaler Commonly known as: VENTOLIN HFA Inhale 2 puffs into the lungs every 6 (six) hours as needed for wheezing or shortness of breath.   BLACK COHOSH PO Take 1 tablet by mouth at bedtime.   gabapentin 300 MG capsule Commonly known as: NEURONTIN Take 300 mg by mouth daily.   nicotine  21 mg/24hr patch Commonly known as: NICODERM CQ - dosed in mg/24 hours Place 1 patch (21 mg total) onto the skin daily.       Follow-up Information    [place], [facility] Follow up.   Specialty: Internal Medicine Contact information: [address] [place] [phone]              Allergies  Allergen Reactions  . Oxycodone Itching  . Tramadol Other (See Comments)    arrhythmia     Consultations:  none   Procedures/Studies: DG Chest Portable 1 View  Result Date: 06/14/2020 CLINICAL DATA:  COVID-19 positivity with shortness of breath EXAM: PORTABLE CHEST 1 VIEW COMPARISON:  05/08/2015 FINDINGS: Cardiac shadows within normal limits. Patient rotation to the right accentuates the mediastinal markings. No focal infiltrate or effusion is seen. No bony abnormality is noted. IMPRESSION: No acute abnormality seen. Electronically Signed   By: 05/10/2015 M.D.   On: 06/14/2020 15:20    (Echo, Carotid, EGD, Colonoscopy, ERCP)    Subjective: Resting in bed anxious to go home  Discharge Exam: Vitals:   06/16/20 2135 06/17/20 0629  BP: 134/82 (!) 91/53  Pulse: 94 89  Resp: 18 20  Temp: 98.2 F (36.8 C) 98.6 F (37 C)  SpO2: 94% 94%   Vitals:   06/16/20 0552 06/16/20 1324 06/16/20 2135 06/17/20 0629  BP: 128/83 114/75 134/82 (!) 91/53  Pulse: 78 85 94 89  Resp: 18 18 18 20   Temp: 98.4 F (36.9 C) 98.1 F (36.7 C) 98.2 F (36.8 C) 98.6 F (37 C)  TempSrc: Oral Oral Oral Oral  SpO2: 93% 96% 94% 94%  Weight:      Height:        General: Pt is alert, awake, not in acute distress Cardiovascular: RRR, S1/S2 +, no rubs, no gallops Respiratory: few rhonchi b/l Abdominal: Soft, NT, ND, bowel sounds + Extremities: no edema, no cyanosis    The results of significant diagnostics from this hospitalization (including imaging, microbiology, ancillary and laboratory) are listed below for reference.     Microbiology: Recent Results  (from the past 240 hour(s))  Resp Panel by RT-PCR (Flu A&B, Covid) Nasopharyngeal Swab     Status: Abnormal   Collection Time: 06/14/20  5:14 PM   Specimen: Nasopharyngeal Swab; Nasopharyngeal(NP) swabs in vial transport medium  Result Value Ref Range Status   SARS Coronavirus 2 by RT PCR POSITIVE (A) NEGATIVE Final    Comment: RESULT CALLED TO, READ BACK BY AND VERIFIED WITH: JAMIE BAILEY RN AT 1909 ON 06/14/20 BY I.SUGUT (NOTE) SARS-CoV-2 target nucleic acids are DETECTED.  The SARS-CoV-2 RNA is generally detectable in upper respiratory specimens during the acute phase of infection. Positive results are indicative of the presence of the identified virus, but do not rule out bacterial infection or co-infection with other pathogens not detected by the test. Clinical correlation with patient history and other diagnostic information is necessary to determine patient infection status. The expected result is Negative.  Fact Sheet for Patients: 08/14/20  Fact Sheet for Healthcare Providers: BloggerCourse.com  This test is not yet approved or cleared by the [place] FDA and  has been authorized for detection and/or diagnosis of SARS-CoV-2 by FDA under an Emergency Use Authorization (EUA).  This EUA will remain in effect (meaning this  test can be used) for the duration of  the COVID-19 declaration under Section 564(b)(1) of the Act, 21 U.S.C. section 360bbb-3(b)(1), unless the authorization is terminated or revoked sooner.     Influenza A by PCR NEGATIVE NEGATIVE Final   Influenza B by PCR NEGATIVE NEGATIVE Final    Comment: (NOTE) The Xpert Xpress SARS-CoV-2/FLU/RSV plus assay is intended as an aid in the diagnosis of influenza from Nasopharyngeal swab specimens and should not be used as a sole basis for treatment. Nasal washings and aspirates are unacceptable for Xpert Xpress SARS-CoV-2/FLU/RSV testing.  Fact Sheet for  Patients: [place]  Fact Sheet for Healthcare Providers: BloggerCourse.com  This test is not yet approved or cleared by the [place] FDA and has been authorized for detection and/or diagnosis of SARS-CoV-2 by FDA under an Emergency Use Authorization (EUA). This EUA will remain in effect (meaning this test can be used) for the duration of the COVID-19 declaration under Section 564(b)(1) of the Act, 21 U.S.C. section 360bbb-3(b)(1), unless the authorization is terminated or revoked.  Performed at [facility], [address] [place], [place]   Blood Culture (routine x 2)     Status: None (Preliminary result)   Collection Time: 06/14/20  6:50 PM   Specimen: BLOOD LEFT HAND  Result Value Ref Range Status   Specimen Description   Final    BLOOD LEFT HAND Performed at [facility], [address] [place], [place]    Special Requests   Final    BOTTLES DRAWN AEROBIC AND ANAEROBIC Blood Culture adequate volume Performed at  [facility] [place], [address] [place], [place] [address]    Culture   Final    NO GROWTH 2 DAYS Performed at [facility], [address] [place], [place] [address]    Report Status PENDING  Incomplete  Blood Culture (routine x 2)     Status: None (Preliminary result)   Collection Time: 06/14/20  7:39 PM   Specimen: BLOOD  Result Value Ref Range Status   Specimen Description   Final    BLOOD LEFT ANTECUBITAL Performed at [facility], [address] [place], [place] [address]    Special Requests   Final    BOTTLES DRAWN AEROBIC AND ANAEROBIC Blood Culture adequate volume Performed at [facility] [place], [address] [place], [place] [address]    Culture   Final    NO GROWTH 2 DAYS Performed at [facility], [address] [place], [place] [address]    Report Status PENDING  Incomplete     Labs: BNP (last 3  results) No results for input(s): BNP in the last 8760 hours. Basic Metabolic Panel: Recent Labs  Lab 06/14/20 1455 06/15/20 0401 06/16/20 0407 06/17/20 0415  NA 132* 135 140 136  K 2.7* 3.8 3.0* 3.2*  CL 94* 101 105 101  CO2 24 24 27 27   GLUCOSE 161* 180* 103* 102*  BUN <5* <5* 5* <5*  CREATININE 0.65 0.61 0.73 0.66  CALCIUM 8.0* 8.2* 8.1* 8.1*  MG 1.8  --   --   --    Liver Function Tests: Recent Labs  Lab 06/15/20 0401 06/16/20 0407 06/17/20 0415  AST 27 19 20   ALT 15 11 12   ALKPHOS 108 97 97  BILITOT 0.3 0.3 0.6  PROT 6.3* 6.1* 6.4*  ALBUMIN 3.0* 2.9* 3.0*   No results for input(s): LIPASE, AMYLASE in the last 168 hours. No results for input(s): AMMONIA in the last 168 hours. CBC: Recent Labs  Lab 06/14/20 1455 06/15/20 0401 06/16/20 0407 06/17/20 0415  WBC 7.7 4.8 5.3 6.6  NEUTROABS  --  3.9 3.1 4.1  HGB 15.4* 13.7 13.2 13.6  HCT 45.6 41.7 40.9 43.0  MCV 86.5 89.3 89.9 90.9  PLT 200 197 165 171   Cardiac Enzymes: No results for input(s): CKTOTAL, CKMB, CKMBINDEX, TROPONINI in the last 168 hours. BNP: Invalid input(s): POCBNP CBG: No results for input(s): GLUCAP in the last 168 hours. D-Dimer Recent Labs    06/16/20 0407 06/17/20 0415  DDIMER 0.48 0.52*   Hgb A1c No results for input(s): HGBA1C in the last 72 hours. Lipid Profile Recent Labs    06/14/20 1836  TRIG 194*   Thyroid function studies No results for input(s): TSH, T4TOTAL, T3FREE, THYROIDAB in the last 72 hours.  Invalid input(s): FREET3 Anemia work up Recent Labs    06/14/20 1836  FERRITIN 136   Urinalysis    Component Value Date/Time   COLORURINE YELLOW 09/09/2018 1156   APPEARANCEUR CLEAR 09/09/2018 1156   LABSPEC 1.015 09/09/2018 1156   PHURINE 6.5 09/09/2018 1156   GLUCOSEU NEGATIVE 09/09/2018 1156   HGBUR MODERATE (A) 09/09/2018 1156   BILIRUBINUR NEGATIVE 09/09/2018 1156   KETONESUR NEGATIVE 09/09/2018 1156   PROTEINUR NEGATIVE 09/09/2018 1156    UROBILINOGEN 0.2 10/10/2014 1935   NITRITE NEGATIVE 09/09/2018 1156   LEUKOCYTESUR SMALL (A) 09/09/2018 1156   Sepsis Labs Invalid input(s): PROCALCITONIN,  WBC,  LACTICIDVEN Microbiology Recent Results (from the past 240 hour(s))  Resp  Panel by RT-PCR (Flu A&B, Covid) Nasopharyngeal Swab     Status: Abnormal   Collection Time: 06/14/20  5:14 PM   Specimen: Nasopharyngeal Swab; Nasopharyngeal(NP) swabs in vial transport medium  Result Value Ref Range Status   SARS Coronavirus 2 by RT PCR POSITIVE (A) NEGATIVE Final    Comment: RESULT CALLED TO, READ BACK BY AND VERIFIED WITH: JAMIE BAILEY RN AT 1909 ON 06/14/20 BY I.SUGUT (NOTE) SARS-CoV-2 target nucleic acids are DETECTED.  The SARS-CoV-2 RNA is generally detectable in upper respiratory specimens during the acute phase of infection. Positive results are indicative of the presence of the identified virus, but do not rule out bacterial infection or co-infection with other pathogens not detected by the test. Clinical correlation with patient history and other diagnostic information is necessary to determine patient infection status. The expected result is Negative.  Fact Sheet for Patients: BloggerCourse.com  Fact Sheet for Healthcare Providers: SeriousBroker.it  This test is not yet approved or cleared by the [place] FDA and  has been authorized for detection and/or diagnosis of SARS-CoV-2 by FDA under an Emergency Use Authorization (EUA).  This EUA will remain in effect (meaning this  test can be used) for the duration of  the COVID-19 declaration under Section 564(b)(1) of the Act, 21 U.S.C. section 360bbb-3(b)(1), unless the authorization is terminated or revoked sooner.     Influenza A by PCR NEGATIVE NEGATIVE Final   Influenza B by PCR NEGATIVE NEGATIVE Final    Comment: (NOTE) The Xpert Xpress SARS-CoV-2/FLU/RSV plus assay is intended as an aid in the diagnosis of  influenza from Nasopharyngeal swab specimens and should not be used as a sole basis for treatment. Nasal washings and aspirates are unacceptable for Xpert Xpress SARS-CoV-2/FLU/RSV testing.  Fact Sheet for Patients: BloggerCourse.com  Fact Sheet for Healthcare Providers: SeriousBroker.it  This test is not yet approved or cleared by the [place] FDA and has been authorized for detection and/or diagnosis of SARS-CoV-2 by FDA under an Emergency Use Authorization (EUA). This EUA will remain in effect (meaning this test can be used) for the duration of the COVID-19 declaration under Section 564(b)(1) of the Act, 21 U.S.C. section 360bbb-3(b)(1), unless the authorization is terminated or revoked.  Performed at [facility], [address] [place], [place] [address]   Blood Culture (routine x 2)     Status: None (Preliminary result)   Collection Time: 06/14/20  6:50 PM   Specimen: BLOOD LEFT HAND  Result Value Ref Range Status   Specimen Description   Final    BLOOD LEFT HAND Performed at [facility], [address] [place], [place] [address]    Special Requests   Final    BOTTLES DRAWN AEROBIC AND ANAEROBIC Blood Culture adequate volume Performed at [facility], [address] [place], [place] [address]    Culture   Final    NO GROWTH 2 DAYS Performed at [facility], [address], [place], [place] [address]    Report Status PENDING  Incomplete  Blood Culture (routine x 2)     Status: None (Preliminary result)   Collection Time: 06/14/20  7:39 PM   Specimen: BLOOD  Result Value Ref Range Status   Specimen Description   Final    BLOOD LEFT ANTECUBITAL Performed at [facility], [address], [place], [place] [address]    Special Requests   Final    BOTTLES DRAWN AEROBIC AND  ANAEROBIC Blood Culture adequate volume Performed at [facility], [address] [place], [place] [address]    Culture   Final    NO GROWTH 2 DAYS Performed at [facility], [address] [place], [place] [address]    Report Status PENDING  Incomplete     Time coordinating discharge: 39 minutes  SIGNED:   Alwyn Ren, MD  [facility] 06/17/2020, 8:24 AM

## 2020-06-19 LAB — CULTURE, BLOOD (ROUTINE X 2)
Culture: NO GROWTH
Culture: NO GROWTH
Special Requests: ADEQUATE
Special Requests: ADEQUATE

## 2021-08-20 ENCOUNTER — Encounter (HOSPITAL_BASED_OUTPATIENT_CLINIC_OR_DEPARTMENT_OTHER): Payer: Self-pay | Admitting: Urology

## 2021-08-20 ENCOUNTER — Emergency Department (HOSPITAL_BASED_OUTPATIENT_CLINIC_OR_DEPARTMENT_OTHER): Payer: Self-pay

## 2021-08-20 ENCOUNTER — Emergency Department (HOSPITAL_COMMUNITY): Payer: Self-pay

## 2021-08-20 ENCOUNTER — Other Ambulatory Visit: Payer: Self-pay

## 2021-08-20 ENCOUNTER — Emergency Department (HOSPITAL_BASED_OUTPATIENT_CLINIC_OR_DEPARTMENT_OTHER)
Admission: EM | Admit: 2021-08-20 | Discharge: 2021-08-20 | Disposition: A | Discharge status: Home / Self Care | Payer: Self-pay | Attending: Emergency Medicine | Admitting: Emergency Medicine

## 2021-08-20 DIAGNOSIS — N390 Urinary tract infection, site not specified: Secondary | ICD-10-CM | POA: Insufficient documentation

## 2021-08-20 DIAGNOSIS — J189 Pneumonia, unspecified organism: Secondary | ICD-10-CM | POA: Insufficient documentation

## 2021-08-20 LAB — TROPONIN I (HIGH SENSITIVITY)
Troponin I (High Sensitivity): 3 ng/L (ref <18)
Troponin I (High Sensitivity): 4 ng/L (ref <18)

## 2021-08-20 LAB — CBC
HCT: 45.9 % (ref 36.0–46.0)
Hemoglobin: 15.1 g/dL — ABNORMAL HIGH (ref 12.0–15.0)
MCH: 28.7 pg (ref 26.0–34.0)
MCHC: 32.9 g/dL (ref 30.0–36.0)
MCV: 87.1 fL (ref 80.0–100.0)
Platelets: 301 10*3/uL (ref 150–400)
RBC: 5.27 MIL/uL — ABNORMAL HIGH (ref 3.87–5.11)
RDW: 14 % (ref 11.5–15.5)
WBC: 11.2 10*3/uL — ABNORMAL HIGH (ref 4.0–10.5)
nRBC: 0 % (ref 0.0–0.2)

## 2021-08-20 LAB — BASIC METABOLIC PANEL
Anion gap: 7 (ref 5–15)
BUN: 6 mg/dL (ref 6–20)
CO2: 25 mmol/L (ref 22–32)
Calcium: 8.8 mg/dL — ABNORMAL LOW (ref 8.9–10.3)
Chloride: 104 mmol/L (ref 98–111)
Creatinine, Ser: 0.86 mg/dL (ref 0.44–1.00)
GFR, Estimated: >60 mL/min (ref >60)
Glucose, Bld: 144 mg/dL — ABNORMAL HIGH (ref 70–99)
Potassium: 3.6 mmol/L (ref 3.5–5.1)
Sodium: 136 mmol/L (ref 135–145)

## 2021-08-20 LAB — URINALYSIS, ROUTINE W REFLEX MICROSCOPIC
Bilirubin Urine: NEGATIVE
Glucose, UA: NEGATIVE mg/dL
Ketones, ur: NEGATIVE mg/dL
Nitrite: POSITIVE — AB
Protein, ur: NEGATIVE mg/dL
Specific Gravity, Urine: <=1.005 (ref 1.005–1.030)
pH: 5.5 (ref 5.0–8.0)

## 2021-08-20 LAB — URINALYSIS, MICROSCOPIC (REFLEX)

## 2021-08-20 LAB — D-DIMER, QUANTITATIVE: D-Dimer, Quant: <0.27 ug/mL-FEU (ref 0.00–0.50)

## 2021-08-20 MED ORDER — HYDROCODONE-ACETAMINOPHEN 5-325 MG PO TABS
1.0000 | ORAL_TABLET | Freq: Once | ORAL | Status: AC
  Administered 2021-08-20: 1 via ORAL
  Filled 2021-08-20: qty 1

## 2021-08-20 MED ORDER — IBUPROFEN 600 MG PO TABS: 600.0000 mg | ORAL_TABLET | Freq: Four times a day (QID) | ORAL | 0 refills | Status: DC | PRN

## 2021-08-20 MED ORDER — DOXYCYCLINE HYCLATE 100 MG PO CAPS: 100.0000 mg | ORAL_CAPSULE | Freq: Two times a day (BID) | ORAL | 0 refills | Status: DC

## 2021-08-20 MED ORDER — DOXYCYCLINE HYCLATE 100 MG PO TABS
100.0000 mg | ORAL_TABLET | Freq: Once | ORAL | Status: AC
  Administered 2021-08-20: 100 mg via ORAL
  Filled 2021-08-20: qty 1

## 2021-08-20 MED ORDER — KETOROLAC TROMETHAMINE 15 MG/ML IJ SOLN
15.0000 mg | Freq: Once | INTRAMUSCULAR | Status: AC
  Administered 2021-08-20: 15 mg via INTRAVENOUS
  Filled 2021-08-20: qty 1

## 2021-08-20 NOTE — ED Provider Notes (Signed)
?[facility] EMERGENCY DEPARTMENT ?Provider Note ? ? ?CSN: [id number] ?Arrival date & time: 08/20/21  1608 ? ?  ? ?History ? ?Chief Complaint  ?Patient presents with  ? Chest Pain  ? ? ?Amanda Floyd is a 51 y.o. female. ? ?Patient is a 51 year old female with past medical history of tobacco use presenting for complaints of chest pain.  Patient admits to right-sided chest pain, worse with deep breathing, described as stabbing started this morning.  Chest pain is occurring at rest and only with deep breathing.  Denies any diaphoresis, nausea, or radiation to the back, upper extremities, or jaw.  Denies any recent illnesses, fevers, chills, or coughing. ? ?The history is provided by the patient. No language interpreter was used.  ?Chest Pain ?Associated symptoms: no abdominal pain, no back pain, no cough, no fever, no palpitations, no shortness of breath and no vomiting   ? ?  ? ?Home Medications ?Prior to Admission medications   ?Medication Sig Start Date End Date Taking? Authorizing Provider  ?doxycycline (VIBRAMYCIN) 100 MG capsule Take 1 capsule (100 mg total) by mouth 2 (two) times daily. 08/20/21  Yes Edwin Dada P, DO  ?ibuprofen (ADVIL) 600 MG tablet Take 1 tablet (600 mg total) by mouth every 6 (six) hours as needed. 08/20/21  Yes Edwin Dada P, DO  ?albuterol (VENTOLIN HFA) 108 (90 Base) MCG/ACT inhaler Inhale 2 puffs into the lungs every 6 (six) hours as needed for wheezing or shortness of breath.    [provider]  ?BLACK COHOSH PO Take 1 tablet by mouth at bedtime.    [provider]  ?gabapentin (NEURONTIN) 300 MG capsule Take 300 mg by mouth daily. 09/03/18   [provider]  ?nicotine (NICODERM CQ - DOSED IN MG/24 HOURS) 21 mg/24hr patch Place 1 patch (21 mg total) onto the skin daily. 06/17/20   Alwyn Ren, MD  ?   ? ?Allergies    ?Oxycodone and Tramadol   ? ?Review of Systems   ?Review of Systems  ?Constitutional:  Negative for chills and fever.  ?HENT:   Negative for ear pain and sore throat.   ?Eyes:  Negative for pain and visual disturbance.  ?Respiratory:  Negative for cough and shortness of breath.   ?Cardiovascular:  Positive for chest pain. Negative for palpitations.  ?Gastrointestinal:  Negative for abdominal pain and vomiting.  ?Genitourinary:  Negative for dysuria and hematuria.  ?Musculoskeletal:  Negative for arthralgias and back pain.  ?Skin:  Negative for color change and rash.  ?Neurological:  Negative for seizures and syncope.  ?All other systems reviewed and are negative. ? ?Physical Exam ?Updated Vital Signs ?BP 113/78   Pulse 79   Temp 98.1 ?F (36.7 ?C) (Oral)   Resp 12   Ht 5\' 4"  (1.626 m)   Wt 67.9 kg   SpO2 96%   BMI 25.69 kg/m?  ?Physical Exam ?Vitals and nursing note reviewed.  ?Constitutional:   ?   General: She is not in acute distress. ?   Appearance: She is well-developed.  ?HENT:  ?   Head: Normocephalic and atraumatic.  ?Eyes:  ?   Conjunctiva/sclera: Conjunctivae normal.  ?Cardiovascular:  ?   Rate and Rhythm: Normal rate and regular rhythm.  ?   Heart sounds: No murmur heard. ?Pulmonary:  ?   Effort: Pulmonary effort is normal. No respiratory distress.  ?   Breath sounds: Normal breath sounds. No decreased breath sounds, wheezing, rhonchi or rales.  ?Abdominal:  ?  Palpations: Abdomen is soft.  ?   Tenderness: There is no abdominal tenderness.  ?Musculoskeletal:     ?   General: No swelling.  ?   Cervical back: Neck supple.  ?Skin: ?   General: Skin is warm and dry.  ?   Capillary Refill: Capillary refill takes less than 2 seconds.  ?Neurological:  ?   Mental Status: She is alert.  ?Psychiatric:     ?   Mood and Affect: Mood normal.  ? ? ?ED Results / Procedures / Treatments   ?Labs ?(all labs ordered are listed, but only abnormal results are displayed) ?Labs Reviewed  ?BASIC METABOLIC PANEL - Abnormal; Notable for the following components:  ?    Result Value  ? Glucose, Bld 144 (*)   ? Calcium 8.8 (*)   ? All other components  within normal limits  ?CBC - Abnormal; Notable for the following components:  ? WBC 11.2 (*)   ? RBC 5.27 (*)   ? Hemoglobin 15.1 (*)   ? All other components within normal limits  ?URINALYSIS, ROUTINE W REFLEX MICROSCOPIC - Abnormal; Notable for the following components:  ? Hgb urine dipstick MODERATE (*)   ? Nitrite POSITIVE (*)   ? Leukocytes,Ua TRACE (*)   ? All other components within normal limits  ?URINALYSIS, MICROSCOPIC (REFLEX) - Abnormal; Notable for the following components:  ? Bacteria, UA MANY (*)   ? All other components within normal limits  ?D-DIMER, QUANTITATIVE  ?TROPONIN I (HIGH SENSITIVITY)  ?TROPONIN I (HIGH SENSITIVITY)  ? ? ?EKG ? ?Radiology ?DG Chest 2 View ? ?Result Date: 08/20/2021 ?CLINICAL DATA:  Chest pain EXAM: CHEST - 2 VIEW COMPARISON:  06/14/2020 FINDINGS: Vague opacity in the left upper lobe adjacent to telemetry lead. Additional patchy opacity at the left lung base. No pleural effusion. Normal cardiomediastinal silhouette. No pneumothorax. IMPRESSION: Vague left upper and lower lobe opacities could be due to pneumonia, opacity in the left upper lobe is slightly nodular in appearance, follow-up radiograph in 4-6 weeks is recommended to ensure resolution if symptoms are suggestive of pneumonia; alternatively chest CT could be obtained for further evaluation. Electronically Signed   By: Jasmine PangKim  Fujinaga M.D.   On: 08/20/2021 16:47   ? ?Procedures ?Procedures  ? ? ?Medications Ordered in ED ?Medications  ?HYDROcodone-acetaminophen (NORCO/VICODIN) 5-325 MG per tablet 1 tablet (has no administration in time range)  ?doxycycline (VIBRA-TABS) tablet 100 mg (has no administration in time range)  ?ketorolac (TORADOL) 15 MG/ML injection 15 mg (15 mg Intravenous Given 08/20/21 1730)  ? ? ?ED Course/ Medical Decision Making/ A&P ?  ?                        ?Medical Decision Making ?Amount and/or Complexity of Data Reviewed ?Labs: ordered. ?Radiology: ordered. ? ?Risk ?Prescription drug  management. ? ? ?516:[id number] PM ?51 year old female with past medical history of tobacco use presenting for complaints of chest pain.  Is alert and oriented x3, no acute distress, afebrile, stable vital signs.  No hypoxia.   ? ?The patient's chest pain is not suggestive of pulmonary embolus, cardiac ischemia, aortic dissection, pericarditis, myocarditis, pulmonary embolism, pneumothorax, pneumonia, Zoster, or esophageal perforation, or other serious etiology.  Historically not abrupt in onset, tearing or ripping, pulses symmetric. EKG nonspecific for ischemia/infarction. No dysrhythmias, brugada, WPW, prolonged QT noted. [CXR reviewed and WNL] [Troponin negative x2. CXR reviewed. Labs without demonstration of acute pathology unless otherwise noted above. Low HEART Score: 0-3  points (0.9-1.7% risk of MACE).]  ? ?Chest x-ray demonstrates possible developing pneumonia.  UA demonstrates UTI.  Doxycycline given in ED and sent to pharmacy.  Signs or symptoms of sepsis.  Pneumonia possibly causing chest pain. ? ? ?Given the extremely low risk of these diagnoses further testing and evaluation for these possibilities does not appear to be indicated at this time. Patient in no distress and overall condition improved here in the ED. Detailed discussions were had with the patient regarding current findings, and need for close f/u with PCP or on call doctor. The patient has been instructed to return immediately if the symptoms worsen in any way for re-evaluation. Patient verbalized understanding and is in agreement with current care plan. All questions answered prior to discharge.  ? ?Final Clinical Impression(s) / ED Diagnoses ?Final diagnoses:  ?Urinary tract infection with hematuria, site unspecified  ?Community acquired pneumonia, unspecified laterality  ? ? ?Rx / DC Orders ?ED Discharge Orders   ? ?      Ordered  ?  doxycycline (VIBRAMYCIN) 100 MG capsule  2 times daily       ? 08/20/21 1844  ?  ibuprofen (ADVIL) 600 MG tablet   Every 6 hours PRN       ? 08/20/21 1844  ? ?  ?  ? ?  ? ? ?  ?Franne Forts, DO ?08/20/21 1847 ? ?

## 2021-08-20 NOTE — ED Triage Notes (Signed)
Left side chest since 0430, states stabbing pain with deep breathing  ?Took 2550 mg asa and 81 mg asa at 0800 ?H/o COPD  ?Denies SOB or tightness  ? ?

## 2022-06-13 ENCOUNTER — Ambulatory Visit
Admission: EM | Admit: 2022-06-13 | Discharge: 2022-06-13 | Disposition: A | Discharge status: Home / Self Care | Payer: 59 | Attending: Emergency Medicine | Admitting: Emergency Medicine

## 2022-06-13 DIAGNOSIS — J1282 Pneumonia due to coronavirus disease 2019: Secondary | ICD-10-CM | POA: Diagnosis not present

## 2022-06-13 DIAGNOSIS — R058 Other specified cough: Secondary | ICD-10-CM | POA: Diagnosis not present

## 2022-06-13 DIAGNOSIS — F17210 Nicotine dependence, cigarettes, uncomplicated: Secondary | ICD-10-CM | POA: Insufficient documentation

## 2022-06-13 DIAGNOSIS — Z7952 Long term (current) use of systemic steroids: Secondary | ICD-10-CM | POA: Insufficient documentation

## 2022-06-13 DIAGNOSIS — Z1152 Encounter for screening for COVID-19: Secondary | ICD-10-CM | POA: Insufficient documentation

## 2022-06-13 DIAGNOSIS — J440 Chronic obstructive pulmonary disease with acute lower respiratory infection: Secondary | ICD-10-CM | POA: Insufficient documentation

## 2022-06-13 DIAGNOSIS — J069 Acute upper respiratory infection, unspecified: Secondary | ICD-10-CM

## 2022-06-13 DIAGNOSIS — Z8616 Personal history of COVID-19: Secondary | ICD-10-CM | POA: Insufficient documentation

## 2022-06-13 DIAGNOSIS — Z7951 Long term (current) use of inhaled steroids: Secondary | ICD-10-CM | POA: Insufficient documentation

## 2022-06-13 DIAGNOSIS — K589 Irritable bowel syndrome without diarrhea: Secondary | ICD-10-CM | POA: Insufficient documentation

## 2022-06-13 MED ORDER — PREDNISONE 20 MG PO TABS: 40.0000 mg | ORAL_TABLET | Freq: Every day | ORAL | 0 refills | Status: AC

## 2022-06-13 MED ORDER — ALBUTEROL SULFATE HFA 108 (90 BASE) MCG/ACT IN AERS: INHALATION_SPRAY | RESPIRATORY_TRACT | 2 refills | Status: AC

## 2022-06-13 MED ORDER — CYCLOBENZAPRINE HCL 10 MG PO TABS: 10.0000 mg | ORAL_TABLET | Freq: Two times a day (BID) | ORAL | 0 refills | Status: AC | PRN

## 2022-06-13 NOTE — ED Triage Notes (Signed)
Pt presents with c/o cough, congestion and body aches x 3 days

## 2022-06-13 NOTE — ED Provider Notes (Signed)
[facility]    CSN: [id number] Arrival date & time: 06/13/22  1337     History   Chief Complaint Chief Complaint  Patient presents with   Cough   Nasal Congestion   Generalized Body Aches    HPI GUILLERMA GRAVETTE is a 52 y.o. female.  Here with 3-day history of cough, nasal congestion, body aches. Cough is productive  Aches 5/10 No temp taken but has felt hot and cold Has tried tylenol and ibuprofen   Possible sick contact Reports hx of asthma and COPD  Past Medical History:  Diagnosis Date   Asthma    COPD (chronic obstructive pulmonary disease) (HCC)    Endometriosis    IBS (irritable bowel syndrome)    Pneumothorax    Rectal pain     Patient Active Problem List   Diagnosis Date Noted   COVID-19 virus infection 06/15/2020   Pneumonia due to COVID-19 virus 06/15/2020   Hypokalemia 06/14/2020    Past Surgical History:  Procedure Laterality Date   CHEST TUBE INSERTION  2011   for a couple of days during hospitalization   ENDOMETRIAL BIOPSY     LAPAROSCOPIC ENDOMETRIOSIS FULGURATION      OB History     Gravida  5   Para  5   Term  0   Preterm  0   AB  0   Living  5      SAB  0   IAB  0   Ectopic  0   Multiple  0   Live Births  5        Obstetric Comments  All vaginal, full term deliveries          Home Medications    Prior to Admission medications   Medication Sig Start Date End Date Taking? Authorizing Provider  cyclobenzaprine (FLEXERIL) 10 MG tablet Take 1 tablet (10 mg total) by mouth 2 (two) times daily as needed for muscle spasms. 06/13/22  Yes Adams Hinch, Wells Guiles, PA-C  predniSONE (DELTASONE) 20 MG tablet Take 2 tablets (40 mg total) by mouth daily with breakfast for 5 days. 06/13/22 06/18/22 Yes Anaid Haney, Wells Guiles, PA-C  albuterol (VENTOLIN HFA) 108 (90 Base) MCG/ACT inhaler 1 puff into the lungs every 6 hours as needed 06/13/22   Dax Murguia, Wells Guiles, PA-C    Family History Family History  Problem Relation Age of Onset    Cancer Mother        breast CA   Diabetes Mother    Cancer Father        breast CA, lymphoma   Diabetes Father    Cancer Sister     Social History Social History   Tobacco Use   Smoking status: Every Day    Packs/day: 1.00    Types: Cigarettes   Smokeless tobacco: Never  Substance Use Topics   Alcohol use: Not Currently    Alcohol/week: 4.0 standard drinks of alcohol    Types: 4 Cans of beer per week   Drug use: No     Allergies   Oxycodone and Tramadol   Review of Systems Review of Systems  Respiratory:  Positive for cough.    As per HPI  Physical Exam Triage Vital Signs ED Triage Vitals  Enc Vitals Group     BP 06/13/22 1346 115/79     Pulse Rate 06/13/22 1346 76     Resp 06/13/22 1346 14     Temp 06/13/22 1346 98.1 F (36.7 C)  Temp Source 06/13/22 1346 Oral     SpO2 06/13/22 1346 96 %     Weight --      Height --      Head Circumference --      Peak Flow --      Pain Score 06/13/22 1345 5     Pain Loc --      Pain Edu? --      Excl. in [place]? --    No data found.  Updated Vital Signs BP 115/79 (BP Location: Right Arm)   Pulse 76   Temp 98.1 F (36.7 C) (Oral)   Resp 14   SpO2 96%     Physical Exam Vitals and nursing note reviewed.  Constitutional:      Appearance: She is ill-appearing.  HENT:     Nose: Congestion present. No rhinorrhea.     Mouth/Throat:     Mouth: Mucous membranes are moist.     Pharynx: Oropharynx is clear. No posterior oropharyngeal erythema.  Eyes:     Conjunctiva/sclera: Conjunctivae normal.  Cardiovascular:     Rate and Rhythm: Normal rate and regular rhythm.     Pulses: Normal pulses.     Heart sounds: Normal heart sounds.  Pulmonary:     Effort: Pulmonary effort is normal.     Breath sounds: Normal breath sounds.     Comments: Clear throughout  Musculoskeletal:     Cervical back: Normal range of motion.  Lymphadenopathy:     Cervical: No cervical adenopathy.  Skin:    General: Skin is warm and  dry.  Neurological:     Mental Status: She is alert and oriented to person, place, and time.     UC Treatments / Results  Labs (all labs ordered are listed, but only abnormal results are displayed) Labs Reviewed  SARS CORONAVIRUS 2 (TAT 6-24 HRS)    EKG   Radiology No results found.  Procedures Procedures (including critical care time)  Medications Ordered in UC Medications - No data to display  Initial Impression / Assessment and Plan / UC Course  I have reviewed the triage vital signs and the nursing notes.  Pertinent labs & imaging results that were available during my care of the patient were reviewed by me and considered in my medical decision making (see chart for details).  Afebrile  Discussed viral etiology, symptomatic care Covid test pending. If positive, candidate for Paxlovid (GFR >60)  Recommend albuterol inhaler q6 hours prn Prednisone daily x 5 Continue ibu and tylenol. Can try flexeril BID Return precautions discussed. Patient agrees to plan  Final Clinical Impressions(s) / UC Diagnoses   Final diagnoses:  Viral URI with cough     Discharge Instructions      Continue ibuprofen/tylenol for aches You can try the muscle relaxer twice daily as well Prednisone once daily for 5 days I recommend to use inhaler 3x daily for the next several days Drink lots of fluids! Please return as needed     ED Prescriptions     Medication Sig Dispense Auth. Provider   albuterol (VENTOLIN HFA) 108 (90 Base) MCG/ACT inhaler 1 puff into the lungs every 6 hours as needed 1 each Remy Dia, PA-C   cyclobenzaprine (FLEXERIL) 10 MG tablet Take 1 tablet (10 mg total) by mouth 2 (two) times daily as needed for muscle spasms. 20 tablet Jenina Moening, PA-C   predniSONE (DELTASONE) 20 MG tablet Take 2 tablets (40 mg total) by mouth daily with breakfast for 5  days. 10 tablet Breona Cherubin, Wells Guiles, PA-C      PDMP not reviewed this encounter.   Elaijah Munoz, Wells Guiles,  [place] 06/13/22 1428

## 2022-06-13 NOTE — Discharge Instructions (Signed)
Continue ibuprofen/tylenol for aches You can try the muscle relaxer twice daily as well Prednisone once daily for 5 days I recommend to use inhaler 3x daily for the next several days Drink lots of fluids! Please return as needed

## 2022-06-14 LAB — SARS CORONAVIRUS 2 (TAT 6-24 HRS): SARS Coronavirus 2: NEGATIVE

## 2022-09-23 ENCOUNTER — Other Ambulatory Visit: Payer: Self-pay

## 2022-09-23 ENCOUNTER — Emergency Department (HOSPITAL_BASED_OUTPATIENT_CLINIC_OR_DEPARTMENT_OTHER)
Admission: EM | Admit: 2022-09-23 | Discharge: 2022-09-23 | Disposition: A | Discharge status: Home / Self Care | Payer: 59 | Attending: Emergency Medicine | Admitting: Emergency Medicine

## 2022-09-23 DIAGNOSIS — K0889 Other specified disorders of teeth and supporting structures: Secondary | ICD-10-CM | POA: Insufficient documentation

## 2022-09-23 MED ORDER — OXYCODONE HCL 5 MG PO TABS: 5.0000 mg | ORAL_TABLET | ORAL | 0 refills | Status: AC | PRN

## 2022-09-23 NOTE — Discharge Instructions (Signed)
Advil liquid gel 600mg  WITH Tylenol 650mg  every 6 hours for pain. ADD Oxy as needed for additional pain relief. Do NOT drive or operate machinery while taking Oxy. This medication can cause constipation, manage with Miralax and Colace as needed. Continue with medicated mouth wash.  Follow up with your dentist.

## 2022-09-23 NOTE — ED Triage Notes (Signed)
Patient arrives ambulatory by POV c/o dental pain since having oral surgery Friday. Patient states she had 25 teeth removed and states her surgeon would not give her any pain medicine.

## 2022-09-23 NOTE — ED Provider Notes (Signed)
[facility] Provider Note   CSN: [id number] Arrival date & time: 09/23/22  1212     History  Chief Complaint  Patient presents with   Dental Pain    Amanda Floyd is a 52 y.o. female.  52 year old female presents with complaint of dental pain.  Reports having all of her upper teeth removed on Friday (3 days ago).  Is using the medicated mouthwash as well as Motrin and Tylenol as directed by her oral surgeon however states pain is not controlled and she is unable to sleep.  She is trying to hydrate with liquids at home.  Denies fever or drainage.  No other complaints or concerns today.       Home Medications Prior to Admission medications   Medication Sig Start Date End Date Taking? Authorizing Provider  oxyCODONE (ROXICODONE) 5 MG immediate release tablet Take 1 tablet (5 mg total) by mouth every 4 (four) hours as needed for severe pain. 09/23/22  Yes Jeannie Fend, PA-C  albuterol (VENTOLIN HFA) 108 (90 Base) MCG/ACT inhaler 1 puff into the lungs every 6 hours as needed 06/13/22   Rising, Lurena Joiner, PA-C  cyclobenzaprine (FLEXERIL) 10 MG tablet Take 1 tablet (10 mg total) by mouth 2 (two) times daily as needed for muscle spasms. 06/13/22   Rising, Lurena Joiner, PA-C      Allergies    Oxycodone and Tramadol    Review of Systems   Review of Systems Negative except as per HPI Physical Exam Updated Vital Signs BP 92/77 (BP Location: Left Arm)   Pulse 87   Temp 97.8 F (36.6 C) (Temporal)   Resp 20   Ht 5\' 4"  (1.626 m)   Wt 63.5 kg   SpO2 95%   BMI 24.03 kg/m  Physical Exam Vitals and nursing note reviewed.  Constitutional:      General: She is not in acute distress.    Appearance: She is well-developed. She is not diaphoretic.  HENT:     Head: Normocephalic and atraumatic.     Jaw: No trismus.     Mouth/Throat:     Mouth: Mucous membranes are moist.     Comments: Maxilla examined, recent extractions, sutures in place.  No  drainage, appears to be healing well.  Does have maxillary swelling without overlying erythema.   Eyes:     Conjunctiva/sclera: Conjunctivae normal.  Pulmonary:     Effort: Pulmonary effort is normal.  Musculoskeletal:     Cervical back: Neck supple.  Lymphadenopathy:     Cervical: No cervical adenopathy.  Skin:    General: Skin is warm and dry.     Findings: No erythema or rash.  Neurological:     Mental Status: She is alert and oriented to person, place, and time.  Psychiatric:        Behavior: Behavior normal.     ED Results / Procedures / Treatments   Labs (all labs ordered are listed, but only abnormal results are displayed) Labs Reviewed - No data to display  EKG None  Radiology No results found.  Procedures Procedures    Medications Ordered in ED Medications - No data to display  ED Course/ Medical Decision Making/ A&P                             Medical Decision Making   52 year old female with dental pain after extraction of all of her upper teeth  3 days ago.  Is taking Motrin 800 mg and Tylenol without control of her pain.  Does not appear to have any evidence of secondary infection, is using her prescribed mouthwash.  Narcotics database reviewed, no prescriptions filed.  Advised patient I will discharge her with a limited course of oxycodone which she can take in addition to Advil Liqui-Gels 600 mg and Tylenol 650 mg every 6 hours.  Follow-up with her dentist as scheduled.  Continue to use medicated rinse.        Final Clinical Impression(s) / ED Diagnoses Final diagnoses:  Pain, dental    Rx / DC Orders ED Discharge Orders          Ordered    oxyCODONE (ROXICODONE) 5 MG immediate release tablet  Every 4 hours PRN        09/23/22 1414              Jeannie Fend, PA-C 09/23/22 1428    Alvira Monday, MD 09/24/22 2132

## 2023-04-11 ENCOUNTER — Telehealth: Payer: Self-pay

## 2023-04-11 NOTE — Progress Notes (Signed)
 [facility] Assistant attempted to call patient on today regarding preventative mammogram screening. No answer from patient after multiple rings. Assistant left confidential voicemail for patient to return call.  Will call back patient back for final attempt.   Baruch Gouty [place]/VBCI  [facility] Assistant-Population Health [phone]

## 2023-09-06 ENCOUNTER — Encounter (HOSPITAL_BASED_OUTPATIENT_CLINIC_OR_DEPARTMENT_OTHER): Payer: Self-pay | Admitting: Emergency Medicine

## 2023-09-06 ENCOUNTER — Emergency Department (HOSPITAL_BASED_OUTPATIENT_CLINIC_OR_DEPARTMENT_OTHER)

## 2023-09-06 ENCOUNTER — Other Ambulatory Visit: Payer: Self-pay

## 2023-09-06 ENCOUNTER — Emergency Department (HOSPITAL_BASED_OUTPATIENT_CLINIC_OR_DEPARTMENT_OTHER): Admission: EM | Admit: 2023-09-06 | Discharge: 2023-09-06 | Disposition: A | Discharge status: Home / Self Care

## 2023-09-06 DIAGNOSIS — R079 Chest pain, unspecified: Secondary | ICD-10-CM | POA: Diagnosis not present

## 2023-09-06 DIAGNOSIS — J441 Chronic obstructive pulmonary disease with (acute) exacerbation: Secondary | ICD-10-CM

## 2023-09-06 DIAGNOSIS — Z7951 Long term (current) use of inhaled steroids: Secondary | ICD-10-CM | POA: Diagnosis not present

## 2023-09-06 DIAGNOSIS — J45909 Unspecified asthma, uncomplicated: Secondary | ICD-10-CM | POA: Insufficient documentation

## 2023-09-06 DIAGNOSIS — R0602 Shortness of breath: Secondary | ICD-10-CM | POA: Diagnosis present

## 2023-09-06 DIAGNOSIS — J449 Chronic obstructive pulmonary disease, unspecified: Secondary | ICD-10-CM | POA: Diagnosis not present

## 2023-09-06 LAB — CBC WITH DIFFERENTIAL/PLATELET
Abs Immature Granulocytes: 0.05 10*3/uL (ref 0.00–0.07)
Basophils Absolute: 0.1 10*3/uL (ref 0.0–0.1)
Basophils Relative: 1 %
Eosinophils Absolute: 0.1 10*3/uL (ref 0.0–0.5)
Eosinophils Relative: 1 %
HCT: 48.1 % — ABNORMAL HIGH (ref 36.0–46.0)
Hemoglobin: 16.4 g/dL — ABNORMAL HIGH (ref 12.0–15.0)
Immature Granulocytes: 1 %
Lymphocytes Relative: 22 %
Lymphs Abs: 2.5 10*3/uL (ref 0.7–4.0)
MCH: 28.9 pg (ref 26.0–34.0)
MCHC: 34.1 g/dL (ref 30.0–36.0)
MCV: 84.8 fL (ref 80.0–100.0)
Monocytes Absolute: 0.7 10*3/uL (ref 0.1–1.0)
Monocytes Relative: 6 %
Neutro Abs: 7.7 10*3/uL (ref 1.7–7.7)
Neutrophils Relative %: 69 %
Platelets: 316 10*3/uL (ref 150–400)
RBC: 5.67 MIL/uL — ABNORMAL HIGH (ref 3.87–5.11)
RDW: 14.2 % (ref 11.5–15.5)
WBC: 11.1 10*3/uL — ABNORMAL HIGH (ref 4.0–10.5)
nRBC: 0 % (ref 0.0–0.2)

## 2023-09-06 LAB — BASIC METABOLIC PANEL WITH GFR
Anion gap: 16 — ABNORMAL HIGH (ref 5–15)
BUN: 18 mg/dL (ref 6–20)
CO2: 22 mmol/L (ref 22–32)
Calcium: 9 mg/dL (ref 8.9–10.3)
Chloride: 99 mmol/L (ref 98–111)
Creatinine, Ser: 0.92 mg/dL (ref 0.44–1.00)
GFR, Estimated: >60 mL/min (ref >60)
Glucose, Bld: 134 mg/dL — ABNORMAL HIGH (ref 70–99)
Potassium: 3.6 mmol/L (ref 3.5–5.1)
Sodium: 137 mmol/L (ref 135–145)

## 2023-09-06 LAB — TROPONIN T, HIGH SENSITIVITY: Troponin T High Sensitivity: <15 ng/L (ref <19)

## 2023-09-06 MED ORDER — PREDNISONE 10 MG (21) PO TBPK: ORAL_TABLET | Freq: Every day | ORAL | 0 refills | Status: AC

## 2023-09-06 NOTE — ED Notes (Signed)
 ambulated pt- O2 sats 98% on RA, H 91/ pt tolerated ambulating

## 2023-09-06 NOTE — ED Notes (Signed)
 Pt refuses 2nd trop to be obtained. MD made aware

## 2023-09-06 NOTE — ED Notes (Signed)
 Pt seen in triage by this RT, SATS on room air 95- 98%. Clear dim BLB. Pt in no distress at this time

## 2023-09-06 NOTE — Discharge Instructions (Signed)
 Please follow-up with your pulmonologist and your primary doctor.  We are prescribing you steroids.  Please use your inhaler every 4 hours as needed.  Return immediately for fevers, chills, passout, return of chest pain, shortness of breath or increased work of breathing.  You may also return if develop any new or worsening symptoms that are concerning to you.

## 2023-09-06 NOTE — ED Provider Notes (Signed)
 [place] [facility] Provider Note   CSN: [id number] Arrival date & time: 09/06/23  2039     History  Chief Complaint  Patient presents with   Chest Pain   Shortness of Breath    Amanda Floyd is a 53 y.o. female.  This is a 53 year old female presented with shortness of breath and chest pain.  History of COPD.  Cough with change in sputum production.  Reports symptoms have resolved currently and she is not having difficulty breathing, but at home is having significant wheezing.  Also notes some intermittent chest pain as well with palpitations.  Some dyspnea on exertion.   Chest Pain Associated symptoms: shortness of breath   Shortness of Breath Associated symptoms: chest pain        Home Medications Prior to Admission medications   Medication Sig Start Date End Date Taking? Authorizing Provider  albuterol  (VENTOLIN  HFA) 108 (90 Base) MCG/ACT inhaler 1 puff into the lungs every 6 hours as needed 06/13/22   Rising, Ivette Marks, PA-C  cyclobenzaprine  (FLEXERIL ) 10 MG tablet Take 1 tablet (10 mg total) by mouth 2 (two) times daily as needed for muscle spasms. 06/13/22   Rising, Ivette Marks, PA-C  oxyCODONE  (ROXICODONE ) 5 MG immediate release tablet Take 1 tablet (5 mg total) by mouth every 4 (four) hours as needed for severe pain. 09/23/22   Darlis Eisenmenger, PA-C      Allergies    Oxycodone  and Tramadol    Review of Systems   Review of Systems  Respiratory:  Positive for shortness of breath.   Cardiovascular:  Positive for chest pain.    Physical Exam Updated Vital Signs BP 121/67 (BP Location: Right Arm)   Pulse 91   Temp 98.2 F (36.8 C) (Oral)   Resp 19   Ht 5\' 4"  (1.626 m)   Wt 63.5 kg   SpO2 98%   BMI 24.03 kg/m  Physical Exam Vitals and nursing note reviewed.  Constitutional:      General: She is not in acute distress.    Appearance: She is not toxic-appearing.  Cardiovascular:     Rate and Rhythm: Normal rate and regular  rhythm.     Heart sounds: Normal heart sounds.  Pulmonary:     Effort: Pulmonary effort is normal. No tachypnea, accessory muscle usage or respiratory distress.     Breath sounds: Normal breath sounds. No wheezing, rhonchi or rales.  Musculoskeletal:     Right lower leg: No edema.     Left lower leg: No edema.  Skin:    General: Skin is warm.     Capillary Refill: Capillary refill takes less than 2 seconds.  Neurological:     Mental Status: She is alert and oriented to person, place, and time.  Psychiatric:        Mood and Affect: Mood normal.        Behavior: Behavior normal.     ED Results / Procedures / Treatments   Labs (all labs ordered are listed, but only abnormal results are displayed) Labs Reviewed  BASIC METABOLIC PANEL WITH GFR - Abnormal; Notable for the following components:      Result Value   Glucose, Bld 134 (*)    Anion gap 16 (*)    All other components within normal limits  CBC WITH DIFFERENTIAL/PLATELET - Abnormal; Notable for the following components:   WBC 11.1 (*)    RBC 5.67 (*)    Hemoglobin 16.4 (*)  HCT 48.1 (*)    All other components within normal limits  TROPONIN T, HIGH SENSITIVITY    EKG EKG Interpretation Date/Time:  Saturday Sep 06 2023 20:46:58 EDT Ventricular Rate:  86 PR Interval:  143 QRS Duration:  85 QT Interval:  385 QTC Calculation: 461 R Axis:   153  Text Interpretation: Sinus rhythm Right atrial enlargement Right axis deviation Nonspecific T abnrm, anterolateral leads Confirmed by Elise Guile [phone]) on 09/06/2023 10:36:16 PM  Radiology DG Chest 2 View Result Date: 09/06/2023 CLINICAL DATA:  Chest pain EXAM: CHEST - 2 VIEW COMPARISON:  X-ray 06/14/2020, 08/20/2021. FINDINGS: Hyperinflation. No consolidation, pneumothorax or effusion. Normal cardiopericardial silhouette without edema. Degenerative changes along the spine. Air-fluid level along the stomach beneath the left hemidiaphragm. IMPRESSION: Hyperinflation with  chronic changes. No acute cardiopulmonary disease. Electronically Signed   By: Adrianna Horde M.D.   On: 09/06/2023 21:33    Procedures Procedures    Medications Ordered in ED Medications - No data to display  ED Course/ Medical Decision Making/ A&P                                 Medical Decision Making This is a 53 year old female presenting emergency department for shortness of breath.  History of COPD and asthma.  Not on oxygen.  She is afebrile nontachycardic maintaining oxygen saturation on room air.  Clear lungs on exam, speaking in full sentences.  Not in distress.  Ambulated without desaturation.  Chest x-ray with no pneumonia.  She does have a minor white count, but does take chronic inhaled steroids.  No metabolic abnormalities.  EKG sinus without ST segment changes to indicate ischemia independent review.  Troponin negative.  ACS unlikely.  Low risk for PE based on Wells criteria.  Symptoms seemingly related to COPD versus anxiety.  Offered breathing treatment, but states she states that her shortness of breath is resolved.  Will give short course of steroids.  Stable for discharge.  Amount and/or Complexity of Data Reviewed Labs: ordered. Radiology: ordered.         Final Clinical Impression(s) / ED Diagnoses Final diagnoses:  None    Rx / DC Orders ED Discharge Orders     None         Rolinda Climes, DO 09/06/23 2309
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
## 2024-04-14 ENCOUNTER — Encounter (HOSPITAL_BASED_OUTPATIENT_CLINIC_OR_DEPARTMENT_OTHER): Payer: Self-pay | Admitting: Emergency Medicine

## 2024-04-14 ENCOUNTER — Emergency Department (HOSPITAL_BASED_OUTPATIENT_CLINIC_OR_DEPARTMENT_OTHER)

## 2024-04-14 ENCOUNTER — Emergency Department (HOSPITAL_BASED_OUTPATIENT_CLINIC_OR_DEPARTMENT_OTHER)
Admission: EM | Admit: 2024-04-14 | Discharge: 2024-04-14 | Disposition: A | Discharge status: Home / Self Care | Attending: Emergency Medicine | Admitting: Emergency Medicine

## 2024-04-14 ENCOUNTER — Other Ambulatory Visit: Payer: Self-pay

## 2024-04-14 DIAGNOSIS — Y92410 Unspecified street and highway as the place of occurrence of the external cause: Secondary | ICD-10-CM | POA: Diagnosis not present

## 2024-04-14 DIAGNOSIS — M546 Pain in thoracic spine: Secondary | ICD-10-CM | POA: Diagnosis not present

## 2024-04-14 DIAGNOSIS — M542 Cervicalgia: Secondary | ICD-10-CM | POA: Diagnosis present

## 2024-04-14 MED ORDER — ACETAMINOPHEN 500 MG PO TABS
1000.0000 mg | ORAL_TABLET | Freq: Once | ORAL | Status: AC
  Administered 2024-04-14: 1000 mg via ORAL
  Filled 2024-04-14: qty 2

## 2024-04-14 MED ORDER — METHOCARBAMOL 500 MG PO TABS: 500.0000 mg | ORAL_TABLET | Freq: Two times a day (BID) | ORAL | 0 refills | Status: AC

## 2024-04-14 MED ORDER — METHOCARBAMOL 500 MG PO TABS
500.0000 mg | ORAL_TABLET | Freq: Once | ORAL | Status: AC
  Administered 2024-04-14: 500 mg via ORAL
  Filled 2024-04-14: qty 1

## 2024-04-14 NOTE — ED Notes (Signed)
 Pt states that she just took three ibuprofen  for headache around 8:20pm. Also having back, neck and knee pain.

## 2024-04-14 NOTE — ED Notes (Signed)
Patient verbalizes understanding of discharge instructions. Opportunity for questioning and answers were provided. Armband removed by staff, pt discharged from ED. Ambulated out to lobby with friend
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
## 2024-04-14 NOTE — Discharge Instructions (Addendum)
 While you were in the emergency room, you had CT images that were negative.  You likely will feel more sore tomorrow the next day.  This is normal.  I sent you a prescription for medicine called Robaxin.  You may take this up to 2 times per day for muscle pain.  Do not drive if you are taking Robaxin as it may make you sleepy.  I also recommend taking Motrin  and Tylenol .  Follow-up with your primary care doctor.
